# Patient Record
Sex: Male | Born: 1935 | Race: Black or African American | Hispanic: No | Marital: Married | Smoking: Former smoker
Health system: Southern US, Community
[De-identification: ages and names within clinical notes are randomized; demographics above are authoritative.]

## PROBLEM LIST (undated history)

## (undated) DIAGNOSIS — I10 Essential (primary) hypertension: Secondary | ICD-10-CM

## (undated) DIAGNOSIS — R443 Hallucinations, unspecified: Secondary | ICD-10-CM

## (undated) DIAGNOSIS — G3183 Dementia with Lewy bodies: Secondary | ICD-10-CM

## (undated) DIAGNOSIS — F0280 Dementia in other diseases classified elsewhere without behavioral disturbance: Secondary | ICD-10-CM

## (undated) DIAGNOSIS — E1142 Type 2 diabetes mellitus with diabetic polyneuropathy: Secondary | ICD-10-CM

## (undated) DIAGNOSIS — R269 Unspecified abnormalities of gait and mobility: Secondary | ICD-10-CM

## (undated) DIAGNOSIS — H538 Other visual disturbances: Secondary | ICD-10-CM

## (undated) DIAGNOSIS — I639 Cerebral infarction, unspecified: Secondary | ICD-10-CM

## (undated) DIAGNOSIS — H532 Diplopia: Secondary | ICD-10-CM

## (undated) DIAGNOSIS — E119 Type 2 diabetes mellitus without complications: Secondary | ICD-10-CM

## (undated) DIAGNOSIS — G2581 Restless legs syndrome: Secondary | ICD-10-CM

## (undated) DIAGNOSIS — E7800 Pure hypercholesterolemia, unspecified: Secondary | ICD-10-CM

## (undated) DIAGNOSIS — I350 Nonrheumatic aortic (valve) stenosis: Secondary | ICD-10-CM

## (undated) HISTORY — DX: Unspecified abnormalities of gait and mobility: R26.9

## (undated) HISTORY — PX: WHIPPLE PROCEDURE: SHX2667

## (undated) HISTORY — DX: Pure hypercholesterolemia, unspecified: E78.00

## (undated) HISTORY — DX: Diplopia: H53.2

## (undated) HISTORY — DX: Type 2 diabetes mellitus without complications: E11.9

## (undated) HISTORY — DX: Nonrheumatic aortic (valve) stenosis: I35.0

## (undated) HISTORY — DX: Other visual disturbances: H53.8

## (undated) HISTORY — DX: Essential (primary) hypertension: I10

## (undated) HISTORY — DX: Type 2 diabetes mellitus with diabetic polyneuropathy: E11.42

## (undated) HISTORY — DX: Dementia in other diseases classified elsewhere, unspecified severity, without behavioral disturbance, psychotic disturbance, mood disturbance, and anxiety: F02.80

## (undated) HISTORY — DX: Hallucinations, unspecified: R44.3

## (undated) HISTORY — DX: Dementia with Lewy bodies: G31.83

## (undated) HISTORY — DX: Cerebral infarction, unspecified: I63.9

## (undated) HISTORY — PX: INGUINAL HERNIA REPAIR: SUR1180

## (undated) HISTORY — DX: Restless legs syndrome: G25.81

---
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
## 1998-01-19 ENCOUNTER — Ambulatory Visit (HOSPITAL_COMMUNITY): Admission: RE | Admit: 1998-01-19 | Discharge: 1998-01-19 | Payer: Self-pay | Admitting: Family Medicine

## 1999-07-05 ENCOUNTER — Encounter: Admission: RE | Admit: 1999-07-05 | Discharge: 1999-07-05 | Payer: Self-pay | Admitting: Family Medicine

## 2001-02-25 ENCOUNTER — Encounter: Payer: Self-pay | Admitting: Family Medicine

## 2001-02-25 ENCOUNTER — Encounter: Admission: RE | Admit: 2001-02-25 | Discharge: 2001-02-25 | Payer: Self-pay

## 2002-05-25 ENCOUNTER — Encounter: Payer: Self-pay | Admitting: Neurology

## 2002-05-25 ENCOUNTER — Ambulatory Visit (HOSPITAL_COMMUNITY): Admission: RE | Admit: 2002-05-25 | Discharge: 2002-05-25 | Payer: Self-pay | Admitting: Neurology

## 2002-09-22 HISTORY — PX: PROSTATE SURGERY: SHX751

## 2003-10-10 ENCOUNTER — Encounter: Admission: RE | Admit: 2003-10-10 | Discharge: 2003-10-10 | Payer: Self-pay | Admitting: Family Medicine

## 2003-10-18 ENCOUNTER — Encounter: Admission: RE | Admit: 2003-10-18 | Discharge: 2003-10-18 | Payer: Self-pay | Admitting: Family Medicine

## 2003-10-25 ENCOUNTER — Encounter: Admission: RE | Admit: 2003-10-25 | Discharge: 2003-10-25 | Payer: Self-pay | Admitting: Family Medicine

## 2004-03-07 ENCOUNTER — Encounter: Admission: RE | Admit: 2004-03-07 | Discharge: 2004-03-07 | Payer: Self-pay | Admitting: Family Medicine

## 2005-12-04 ENCOUNTER — Encounter: Admission: RE | Admit: 2005-12-04 | Discharge: 2005-12-04 | Payer: Self-pay | Admitting: *Deleted

## 2005-12-10 ENCOUNTER — Ambulatory Visit: Admission: RE | Admit: 2005-12-10 | Discharge: 2005-12-10 | Payer: Self-pay | Admitting: Family Medicine

## 2006-08-05 ENCOUNTER — Ambulatory Visit: Payer: Self-pay | Admitting: Gastroenterology

## 2006-09-03 ENCOUNTER — Ambulatory Visit: Payer: Self-pay | Admitting: Gastroenterology

## 2007-06-23 ENCOUNTER — Ambulatory Visit: Payer: Self-pay | Admitting: Vascular Surgery

## 2008-09-22 HISTORY — PX: CATARACT EXTRACTION: SUR2

## 2008-10-27 ENCOUNTER — Ambulatory Visit: Payer: Self-pay | Admitting: Vascular Surgery

## 2009-03-05 ENCOUNTER — Encounter: Admission: RE | Admit: 2009-03-05 | Discharge: 2009-03-05 | Payer: Self-pay | Admitting: General Surgery

## 2009-03-08 ENCOUNTER — Ambulatory Visit (HOSPITAL_BASED_OUTPATIENT_CLINIC_OR_DEPARTMENT_OTHER): Admission: RE | Admit: 2009-03-08 | Discharge: 2009-03-08 | Payer: Self-pay | Admitting: General Surgery

## 2009-11-20 ENCOUNTER — Encounter: Admission: RE | Admit: 2009-11-20 | Discharge: 2009-11-20 | Payer: Self-pay | Admitting: Neurology

## 2010-07-17 ENCOUNTER — Ambulatory Visit: Payer: Self-pay | Admitting: Vascular Surgery

## 2010-12-30 LAB — URINALYSIS, ROUTINE W REFLEX MICROSCOPIC
Bilirubin Urine: NEGATIVE
Glucose, UA: NEGATIVE mg/dL
Protein, ur: NEGATIVE mg/dL
Specific Gravity, Urine: 1.023 (ref 1.005–1.030)

## 2010-12-30 LAB — CBC
HCT: 33.4 % — ABNORMAL LOW (ref 39.0–52.0)
Hemoglobin: 11.6 g/dL — ABNORMAL LOW (ref 13.0–17.0)
MCHC: 34.6 g/dL (ref 30.0–36.0)
MCV: 93 fL (ref 78.0–100.0)
RBC: 3.59 MIL/uL — ABNORMAL LOW (ref 4.22–5.81)
RDW: 14 % (ref 11.5–15.5)

## 2010-12-30 LAB — PROTIME-INR
INR: 1.1 (ref 0.00–1.49)
Prothrombin Time: 14.2 seconds (ref 11.6–15.2)

## 2010-12-30 LAB — BASIC METABOLIC PANEL
Creatinine, Ser: 1.07 mg/dL (ref 0.4–1.5)
GFR calc Af Amer: >60 mL/min (ref >60)

## 2010-12-30 LAB — APTT: aPTT: 31 seconds (ref 24–37)

## 2011-02-04 NOTE — Op Note (Signed)
[name], [name]             ACCOUNT NO.:  [id number]   MEDICAL RECORD NO.:  [id number]          PATIENT TYPE:  AMB   LOCATION:  DSC                          FACILITY:  [facility]   PHYSICIAN:  Almond Lint, MD       DATE OF BIRTH:  1923-04-07   DATE OF PROCEDURE:  03/08/2009  DATE OF DISCHARGE:                               OPERATIVE REPORT   PREOPERATIVE DIAGNOSIS:  Right inguinal hernia.   POSTOPERATIVE DIAGNOSIS:  Right inguinal hernia.   PROCEDURE PERFORMED:  Right inguinal herniorrhaphy with mesh.   SURGEON:  Almond Lint, MD   ASSISTANT:  None.   ANESTHESIA:  General and local.   FINDINGS:  Indirect right inguinal hernia and large cord lipoma and  there were no contents of the abdominal cavity and the sac.   ESTIMATED BLOOD LOSS:  Minimal.   COMPLICATIONS:  None known.   PROCEDURE:  [name] was identified in the holding area and taken to  the operating room where he was placed supine on the operating room  table.  General anesthesia with LMA was induced.  The right groin was  clipped, prepped, and draped in a sterile fashion.  The time-out was  performed according to the surgical safety check list.  When all was  correct, we continued.  The pubis was identified as well as anterior-  superior iliac spine.  A curvilinear oblique incision was marked between  these 2 points.  Approximately, a centimeter incision was made in the  center of this line.  The subcutaneous tissues were divided with the  Bovie electrocautery.  The Scarpa fascia was divided with the Bovie as  well.  The external oblique fascia was cleaned off bluntly with a  Kittner.  There was 1 small vein encountered at this time and this was  also coagulated.  The externally oblique fascia was opened with a #15  blade and the Metzenbaum scissors were used to elevate the fascia and  cut down to the external inguinal ring inferomedially and then  superolaterally several centimeters and to expose the  internal ring.  The spermatic cord was identified and was elevated off the pubis.  A  Penrose drain was passed around it to retract.   The hernia sac was identified on the medial surface of the spermatic  cord.  This was extremely adherent to the spermatic cord as this patient  had a hernia for 5-10 years.  The hernia sac was elevated with hemostats  and painstakingly dissected from the spermatic cord.  Care was taken to  make sure that the vas deferens was dissected off the sac.  The vas  deferens was identified and was taken down off the sac all the way up to  the internal ring.  The other vessels were not quite so adherent to the  sac.  The sac was cleaned off in all sides to the external ring.  It was  then opened and the contents examined.  There was small bowel seen at  the very posterior most aspect of the sac, but this was still in the  abdomen.  There were no sliding components seen.  This sac was closed  with a purse-string suture of 2-0 silk under direct visualization making  sure that no intraabdominal contents pushed out into the sac.  Following  the purse-string suture, this was then ligated doubly with a 2-0 silk.  The sac was transected and the stump was allowed to retract back into  the abdomen.  Polypropylene mesh was then cut to the appropriate size  and secured to the inferior shelving edge with a 2-0 Prolene in a  running fashion.  Tails were cut to pass around the spermatic cord and  then the superior aspect of the mesh was used to secure this to the  shelving edge superiorly.  The tails were then secured to one another  with a 2-0 Prolene.  The end of the mesh was tucked up underneath the  external oblique fascia and pressed flat.  There was still adequate gap  in the spermatic cord to allow for passage of forceps.  The external  oblique was then closed over the top of the mesh and the spermatic cord  with a 2-0 Vicryl in a running fashion.  This was then irrigated.   Prior  to closure of Scarpa fascia, the area underneath the mesh was  anesthetized.  When placing the stitch for the superior shelving edge,  the ilioinguinal nerve was kept above the level of the mesh.  This was  done in order to avoid incorporating the nerve into the suture.  Scarpa's was then closed using a 2-0 Vicryl in a running fashion and  then the skin was closed with 3-0 Vicryl deep dermal and 4-0 Monocryl  subcuticular.  The wound was cleaned, dried, and dressed with Dermabond.  The patient was awakened from anesthesia and taken to the PACU in stable  condition.      Almond Lint, MD  Electronically Signed     FB/MEDQ  D:  03/08/2009  T:  03/08/2009  Job:  [id number]

## 2011-02-04 NOTE — Assessment & Plan Note (Signed)
OFFICE VISIT   KENNAN, DETTER  DOB:  [birth year]                                       07/17/2010  KGMWN#:[id number]   Patient presents today for evaluation of venous pathology of his left  leg.  I had met patient initially with some concern regarding arterial  insufficiency in February 2010.  He was found to have normal lower  extremity arterial insufficiency, and we had discussed his venous  pathology at that time.  He presents today for concern of increasing  discomfort in the large plexus of varicosities in his left medial calf.  He does not have any history of DVT.  Does not have any history of  bleeding from these.  He does report some pain with these with prolonged  standing, and this does not appear to be severe.   PAST HISTORY:  Significant for hypertension, diabetes, elevated  cholesterol.  Does have a history of a prior Whipple.  He does have a  history of a prior prostatectomy.   He is married with 4 children.  He is retired.   PHYSICAL EXAMINATION:  A well-developed, well-nourished gentleman  appearing stated age of 49.  Blood pressure is 114/76, pulse 81,  respirations 18.  He is in no acute distress.  HEENT is normal.  His  radial and dorsalis pedis pulses are 2+ bilaterally.  He does have a  large plexus of veins in the medial aspect of his left calf.   He underwent noninvasive vascular laboratory studies in our office, and  this does reveal reflux throughout his left great saphenous vein with  flow into these large tributary varicosities.   I had a long discussion with patient and his wife present.  I explained  that there was no danger related to this, and we would recommend  treatment only if he was having significant discomfort.  I explained  that the treatment would be laser ablation of his left great saphenous  vein for reduction of his venous hypertension.  He reports that he is  able to tolerate this level of discomfort and  will notify us should he  have any progressive symptoms.  He has attempted to wear graduated  compression garments in the past and is not able to tolerate these and  does wear a light-grade support stocking and was encouraged to continue  this.  We will see him again on an as-needed basis.     Larina Earthly, M.D.  Electronically Signed   TFE/MEDQ  D:  07/17/2010  T:  07/17/2010  Job:  [id number]   cc:   Dr. Clyda Greener

## 2011-02-04 NOTE — Procedures (Signed)
LOWER EXTREMITY VENOUS REFLUX EXAM   INDICATION:   EXAM:  Using color-flow imaging and pulse Doppler spectral analysis, the  left common femoral, superficial femoral, popliteal, posterior tibial,  greater and lesser saphenous veins are evaluated.  There is evidence  suggesting deep venous insufficiency in the superficial femoral vein of  the left lower extremity.   The left saphenofemoral junction is not competent with Reflux of  >552milliseconds. The left GSV is not competent with Reflux of  >571milliseconds with the caliber as described below.   The left proximal short saphenous vein was not seen.   GSV Diameter (used if found to be incompetent only)                                            Right         Left  Proximal Greater Saphenous Vein           cm            0.92 cm  Proximal-to-mid-thigh                     cm            cm  Mid thigh                                 cm            1.09 cm  Mid-distal thigh                          cm            cm  Distal thigh                              cm            0.82 cm  Knee                                      cm            cm   IMPRESSION:  1. Left greater saphenous vein is not competent with reflux with      >510milliseconds.  2. The left greater saphenous vein is not tortuous.  3. The deep venous system is not competent with reflux of      >576milliseconds.  4. The left short saphenous vein is not seen.   ___________________________________________  Larina Earthly, M.D.   OD/MEDQ  D:  07/17/2010  T:  07/17/2010  Job:  [id number]

## 2011-02-04 NOTE — Consult Note (Signed)
NEW PATIENT CONSULTATION   Belsito, Phoenyx V  DOB:  1923-02-05                                       06/23/2007  ZOXWR#:[id number]   HISTORY:  Javan Gonzaga presents today for evaluation of left leg  venous varicosities.  He is a very pleasant, active 77 year old  gentleman with progressive discomfort over his left medial calf from  venous varicosities.  He reports that he has had these for many years.  He does report an aching, burning sensation in his left calf with  prolonged standing and does have some mild swelling.  He does not have  any history of DVT or superficial thrombophlebitis.  He does not have  any history of bleeding from his varicosities.  He does wear  nonprescription-grade compression stockings, elevates his legs when he  can.   PAST MEDICAL HISTORY:  Significant for anemia, type 2 diabetes, elevated  cholesterol, hypertension.  He does have a history of prior Whipple  resection in [place] in 2003 and also had a prostatectomy in 1994.  He  has a questionable history of abdominal aortic aneurysm.   PHYSICAL EXAMINATION:  GENERAL:  Well-developed, well-nourished black  male appearing stated age of 78.  VITAL SIGNS:  Blood pressure 120/70, pulse 64, respirations 16.  VASCULAR:  He does have 1 to 2+ posterior tibial pulses bilaterally.  He  does not have any evidence of varicosities in his right leg.  Left leg  is noted for marked calf vein varicosities in his medial calf with some  mild tenderness over these.  His saphenous vein is not enlarged in his  thigh.   He underwent a handheld duplex by me in the office, and this reveals  marked reflux in his left great saphenous vein extending into the  varicosities.  I discussed this at length with Mr. Uhde and his wife  present.  I explained that we would recommend better compression grade  compression stockings, and we have fitted him for these today.  I did  explain the option of laser  ablation of his saphenous vein and stab  phlebectomy for  tributary removals should he have progressive pain with  this.  I explained that it is safe to continue observation only and our  indication for recommending the surgery would be progressive discomfort.  He understands this and understands that this is an outpatient procedure  in our office.  We will fit him with compression garments and see him on  an as-needed basis.   Larina Earthly, M.D.  Electronically Signed   TFE/MEDQ  D:  06/23/2007  T:  06/24/2007  Job:  [id number]   cc:   Madaline Savage., M.D.

## 2011-02-04 NOTE — Assessment & Plan Note (Signed)
OFFICE VISIT   JOSE, CORVIN  DOB:  23-Mar-1923                                       10/27/2008  IONGE#:[id number]   The patient presents today for evaluation of left greater than right leg  discomfort.  He is a very pleasant 77 year old gentleman who I had seen  on 06/23/2007 for concern regarding left leg varicosities.  He is a very  active gentleman.  He does continue to report some aching and discomfort  with prolonged standing, he does have some burning sensation over this  but he also has now developed some numbness in his left foot as well.  He has very dry skin and he does have itching bilaterally related to  this.  He has a past history significant for noninsulin dependent  diabetes, hypertension, elevated cholesterol.  He is retired.  He does  not smoke, drink alcohol.  He is on an active walking program along with  his wife.   CURRENT MEDICATIONS:  Lisinopril, metoprolol, Zetia, Iron supplement and  daily aspirin therapy.   He underwent noninvasive vascular laboratory studies in our office today  revealing normal ankle arm indices bilaterally.   PHYSICAL EXAMINATION:  He does have 2+ posterior tibial pulses  bilaterally.  He does have some dry skin bilaterally.  He has  varicosities in his medial calf with no evidence of ulceration or skin  breakdown.  Venous imaging reveals incompetence in his saphenous vein on  the left.   I again discussed this at length with the patient and his wife.  I do  not feel that he has any evidence of limb-threatening difficulty and I  explained the importance of his normal arterial flow.  I explained the  option of treatment for his varicosities, but since this is such a mild  disability to him I would recommend continued observation only.  They  are comfortable with this discussion and will see me again on an as-  needed basis.   Larina Earthly, M.D.  Electronically Signed   TFE/MEDQ  D:  10/27/2008   T:  10/30/2008  Job:  [id number]   cc:   Madaline Savage

## 2011-02-07 NOTE — Letter (Signed)
August 05, 2006    Mr. [name]  [address]  [place], [place] [address]   RE:  GRAIDEN, HENES  MRN:  [id number]  /  DOB:  10-Jan-1923   Dear [name],:   It is my pleasure to have treated you recently as a new patient in my  office.  I appreciate your confidence and the opportunity to participate in  your care.   Since I do have a busy inpatient endoscopy schedule and office schedule, my  office hours vary weekly.  I am, however, available for emergency calls  every day through my office.  If I cannot promptly meet an urgent office  appointment, another one of our gastroenterologists will be able to assist  you.   My well-trained staff are prepared to help you at all times.  For  emergencies after office hours, a physician from our gastroenterology  section is always available through my 24-hour answering service.   While you are under my care, I encourage discussion of your questions and  concerns, and I will be happy to return your calls as soon as I am  available.   Once again, I welcome you as a new patient and I look forward to a happy and  healthy relationship.    Sincerely,      Barbette Hair. Arlyce Dice, MD,FACG  Electronically Signed    RDK/MedQ  DD: 08/06/2006  DT: 08/06/2006  Job #: [id number]

## 2011-02-07 NOTE — Assessment & Plan Note (Signed)
[facility]                           GASTROENTEROLOGY OFFICE NOTE   NAME:[name], [name]                    MRN:          [id number]  DATE:08/05/2006                            DOB:          [birth year]    REASON FOR CONSULTATION:  Abdominal discomfort.   HISTORY:  [name] is a pleasant 77 year old African-American male  referred through the courtesy of Dr. Bruna Potter for evaluation.  Several weeks  ago, he had upper abdominal burning which eventually radiated to his lower  abdomen.  This was associated with a fever to 100.  These symptoms have  subsequently subsided.  He does complain of occasional abdominal bloating  with constipation.  There is no history of melena or hematochezia.   PAST MEDICAL HISTORY:  Pertinent for hypertension and diabetes.  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   FAMILY HISTORY:  Not contributory.   MEDICATIONS:  Include lisinopril, Zetia, metoprolol, and iron.   ALLERGIES:  There are no allergies.   SOCIAL HISTORY:  He is married and retired.   REVIEW OF SYSTEMS:  Just reviewed and is negative.   PHYSICAL EXAMINATION:  On exam, pulse 56, blood pressure 114/72, weight 188.  HEENT: EOMI. PERRLA. Sclerae are anicteric.  Conjunctivae are pink.  NECK:  Supple without thyromegaly, adenopathy or carotid bruits.  CHEST:  Clear to auscultation and percussion without adventitious sounds.  CARDIAC:  Regular rhythm; normal S1 S2.  There are no murmurs, gallops or  rubs.  ABDOMEN:  Bowel sounds are normoactive.  Abdomen is soft, non-tender and non-  distended.  There are no abdominal masses, tenderness, splenic enlargement  or hepatomegaly.  EXTREMITIES:  Full range of motion.  No cyanosis, clubbing or edema.  RECTAL:  Deferred.   IMPRESSION:  Constipation -- stable.   RECOMMENDATIONS:  Screening colonoscopy.    Barbette Hair. Arlyce Dice, MD,FACG  Electronically Signed   RDK/MedQ  DD: 08/06/2006  DT: 08/06/2006  Job #: [id number]   cc:   Billy Greener, MD

## 2011-02-07 NOTE — Letter (Signed)
August 05, 2006    Dr. Clyda Greener  [address] [address]  [place], [place] [address]   RE:  Billy, Williams  MRN:  [id number]  /  DOB:  02/20/1923   Dear Dr. Bruna Potter,   Upon your kind referral, I had the pleasure of evaluating your patient and I  am pleased to offer my findings.  I saw Mr. [name] in the office  today.  Enclosed is a copy of my progress note that details my findings and  recommendations.   Thank you for the opportunity to participate in your patient's care.    Sincerely,      Barbette Hair. Arlyce Dice, MD,FACG  Electronically Signed    RDK/MedQ  DD: 08/06/2006  DT: 08/06/2006  Job #: [id number]

## 2011-03-18 ENCOUNTER — Inpatient Hospital Stay (INDEPENDENT_AMBULATORY_CARE_PROVIDER_SITE_OTHER)
Admission: RE | Admit: 2011-03-18 | Discharge: 2011-03-18 | Disposition: A | Discharge status: Home / Self Care | Payer: Medicare Other | Source: Ambulatory Visit | Attending: Emergency Medicine | Admitting: Emergency Medicine

## 2011-03-18 DIAGNOSIS — S61209A Unspecified open wound of unspecified finger without damage to nail, initial encounter: Secondary | ICD-10-CM

## 2012-09-07 DIAGNOSIS — R42 Dizziness and giddiness: Secondary | ICD-10-CM | POA: Insufficient documentation

## 2012-09-07 DIAGNOSIS — I6789 Other cerebrovascular disease: Secondary | ICD-10-CM | POA: Insufficient documentation

## 2012-09-07 DIAGNOSIS — F068 Other specified mental disorders due to known physiological condition: Secondary | ICD-10-CM | POA: Insufficient documentation

## 2012-09-07 DIAGNOSIS — G3183 Dementia with Lewy bodies: Secondary | ICD-10-CM | POA: Insufficient documentation

## 2012-09-07 DIAGNOSIS — G479 Sleep disorder, unspecified: Secondary | ICD-10-CM | POA: Insufficient documentation

## 2012-09-07 DIAGNOSIS — E1149 Type 2 diabetes mellitus with other diabetic neurological complication: Secondary | ICD-10-CM | POA: Insufficient documentation

## 2012-09-07 DIAGNOSIS — G2581 Restless legs syndrome: Secondary | ICD-10-CM | POA: Insufficient documentation

## 2012-09-07 DIAGNOSIS — R413 Other amnesia: Secondary | ICD-10-CM | POA: Insufficient documentation

## 2012-09-07 DIAGNOSIS — H532 Diplopia: Secondary | ICD-10-CM | POA: Insufficient documentation

## 2012-09-07 DIAGNOSIS — G609 Hereditary and idiopathic neuropathy, unspecified: Secondary | ICD-10-CM | POA: Insufficient documentation

## 2012-09-07 DIAGNOSIS — G544 Lumbosacral root disorders, not elsewhere classified: Secondary | ICD-10-CM | POA: Insufficient documentation

## 2012-09-07 DIAGNOSIS — R209 Unspecified disturbances of skin sensation: Secondary | ICD-10-CM | POA: Insufficient documentation

## 2012-09-08 ENCOUNTER — Other Ambulatory Visit: Payer: Self-pay | Admitting: Neurology

## 2012-09-08 DIAGNOSIS — F0390 Unspecified dementia without behavioral disturbance: Secondary | ICD-10-CM

## 2012-09-16 ENCOUNTER — Ambulatory Visit
Admission: RE | Admit: 2012-09-16 | Discharge: 2012-09-16 | Disposition: A | Discharge status: Home / Self Care | Payer: Medicare Other | Source: Ambulatory Visit | Attending: Neurology | Admitting: Neurology

## 2012-09-16 DIAGNOSIS — F0390 Unspecified dementia without behavioral disturbance: Secondary | ICD-10-CM

## 2013-02-22 ENCOUNTER — Ambulatory Visit (INDEPENDENT_AMBULATORY_CARE_PROVIDER_SITE_OTHER): Payer: Medicare Other | Admitting: Neurology

## 2013-02-22 ENCOUNTER — Encounter: Payer: Self-pay | Admitting: Neurology

## 2013-02-22 VITALS — BP 120/68 | HR 74 | Ht 67.0 in | Wt 155.0 lb

## 2013-02-22 DIAGNOSIS — G479 Sleep disorder, unspecified: Secondary | ICD-10-CM

## 2013-02-22 DIAGNOSIS — I6789 Other cerebrovascular disease: Secondary | ICD-10-CM

## 2013-02-22 DIAGNOSIS — R42 Dizziness and giddiness: Secondary | ICD-10-CM

## 2013-02-22 DIAGNOSIS — G609 Hereditary and idiopathic neuropathy, unspecified: Secondary | ICD-10-CM

## 2013-02-22 DIAGNOSIS — R413 Other amnesia: Secondary | ICD-10-CM

## 2013-02-22 DIAGNOSIS — H532 Diplopia: Secondary | ICD-10-CM

## 2013-02-22 DIAGNOSIS — F068 Other specified mental disorders due to known physiological condition: Secondary | ICD-10-CM

## 2013-02-22 DIAGNOSIS — F0280 Dementia in other diseases classified elsewhere without behavioral disturbance: Secondary | ICD-10-CM

## 2013-02-22 DIAGNOSIS — R209 Unspecified disturbances of skin sensation: Secondary | ICD-10-CM

## 2013-02-22 DIAGNOSIS — G3183 Dementia with Lewy bodies: Secondary | ICD-10-CM

## 2013-02-22 DIAGNOSIS — E1149 Type 2 diabetes mellitus with other diabetic neurological complication: Secondary | ICD-10-CM

## 2013-02-22 DIAGNOSIS — G2581 Restless legs syndrome: Secondary | ICD-10-CM

## 2013-02-22 DIAGNOSIS — G544 Lumbosacral root disorders, not elsewhere classified: Secondary | ICD-10-CM

## 2013-02-22 NOTE — Progress Notes (Signed)
Reason for visit: Lewy body dementia  [name] is an 78 y.o. male  History of present illness:  [name] is an 79 year old right-handed black male with a history of diabetes. The patient has sustained a gradually progressive problem with memory dating back at least 3 or 4 years. The patient initially was having some problems with paranoia, hiding things about the house on a regular basis. The patient however, has demonstrated less of this activity as he is becoming more demented. The patient is on Aricept and Namenda. The patient is sleeping well at night, but he has a lot of problems with visual hallucinations. It is not apparent that these hallucinations result in any agitation or aggressive behavior on the part the patient, but the hallucinations do upset his wife. The patient will see other individuals in the house, and he will want his wife to fix them dinner. The patient does have some gait imbalance, but he has not fallen recently. Within the last 6 months, his wife can no longer leave him at church, as he becomes disoriented unless she is around. The patient returns for an evaluation.  Past Medical History  Diagnosis Date  . Stroke   . Diabetes   . High cholesterol   . HBP (high blood pressure)   . Blurred vision   . Lewy body dementia   . Restless leg syndrome   . Diplopia   . Polyneuropathy in diabetes(357.2)   . Hallucinations     Past Surgical History  Procedure Laterality Date  . Hemorrhoid surgery  1950's  . Prostate surgery  2004  . Cataract extraction Bilateral 2010  . Whipple procedure    . Inguinal hernia repair Right     History reviewed. No pertinent family history.  Social history:  reports that he has quit smoking. He does not have any smokeless tobacco history on file. He reports that he does not drink alcohol or use illicit drugs.  Allergies:  Allergies  Allergen Reactions  . Penicillins     Reaction unknown    Medications:  No  current outpatient prescriptions on file prior to visit.   No current facility-administered medications on file prior to visit.    ROS:  Out of a complete 14 system review of symptoms, the patient complains only of the following symptoms, and all other reviewed systems are negative.  Memory disturbance, confusion, hallucinations Gait disturbance  Blood pressure 120/68, pulse 74, height 5\' 7"  (1.702 m), weight 155 lb (70.308 kg).  Physical Exam  General: The patient is alert and cooperative at the time of the examination.  Skin: No significant peripheral edema is noted.   Neurologic Exam  Mental status: Mini-Mental status examination done today shows a total score of 15/30. The patient is able to name 11 animals in 60 seconds.  Cranial nerves: Facial symmetry is present. Speech is normal, no aphasia or dysarthria is noted. Extraocular movements are full. Visual fields are full.  Motor: The patient has good strength in all 4 extremities.  Coordination: The patient has good finger-nose-finger bilaterally. Apraxia with the use of the lower extremities is seen.  Gait and station: The patient has a normal gait. The patient has good arm swing with walking. Tandem gait is unsteady, apraxic. Romberg is negative. No drift is seen.  Reflexes: Deep tendon reflexes are symmetric.   Assessment/Plan:  1. Lewy body dementia  The patient continues to progress with his memory. The patient is in a moderate dementia range at  this point. The patient will be continued on Aricept and Namenda. He will followup in 6 months. Unless the hallucinations are resulting in significant behavioral issues and management problems, I would not treat. Individuals with Lewy body dementia are often times quite sensitive to antipsychotic medications. The family will contact our office if new problems arise.  Marlan Palau MD 02/22/2013 8:13 PM  [facility] [address] [place], [place] [address]  Phone [phone] Fax [phone]

## 2013-03-30 ENCOUNTER — Telehealth: Payer: Self-pay | Admitting: Neurology

## 2013-04-01 NOTE — Telephone Encounter (Signed)
Voice message left requesting a call-back. 

## 2013-04-04 ENCOUNTER — Telehealth: Payer: Self-pay | Admitting: Neurology

## 2013-04-04 NOTE — Telephone Encounter (Signed)
I spoke to daughter and relayed that [facility] does not pay for in home care (custodial care).  [facility] is a program that may be helpful, also [facility], [facility], etc. Not sure what [facility] may help with.   MD's can order HHC eval.  She would investigate options.

## 2013-04-10 ENCOUNTER — Other Ambulatory Visit: Payer: Self-pay | Admitting: Neurology

## 2013-04-12 ENCOUNTER — Telehealth: Payer: Self-pay | Admitting: Neurology

## 2013-04-12 ENCOUNTER — Other Ambulatory Visit: Payer: Self-pay

## 2013-04-12 MED ORDER — DONEPEZIL HCL 5 MG PO TABS: 5.0000 mg | ORAL_TABLET | Freq: Two times a day (BID) | ORAL | Status: DC

## 2013-04-12 MED ORDER — MEMANTINE HCL ER 28 MG PO CP24: 28.0000 mg | ORAL_CAPSULE | Freq: Every day | ORAL | Status: DC

## 2013-04-12 NOTE — Telephone Encounter (Signed)
This has already been taken care of. Duplicate message

## 2013-06-06 ENCOUNTER — Telehealth: Payer: Self-pay | Admitting: Neurology

## 2013-06-06 ENCOUNTER — Other Ambulatory Visit: Payer: Self-pay

## 2013-06-06 MED ORDER — MEMANTINE HCL 10 MG PO TABS: 10.0000 mg | ORAL_TABLET | Freq: Two times a day (BID) | ORAL | Status: DC

## 2013-06-06 NOTE — Telephone Encounter (Signed)
[facility] called stating they are unable to fill the Rx for Namenda XR because it is on backorder.  I have sent a Rx for Namenda 10mg  BID for them to fill until XR form is available again.

## 2013-06-28 ENCOUNTER — Ambulatory Visit: Payer: Medicare Other | Admitting: Interventional Cardiology

## 2013-07-07 ENCOUNTER — Encounter: Payer: Self-pay | Admitting: *Deleted

## 2013-07-07 ENCOUNTER — Encounter: Payer: Self-pay | Admitting: Interventional Cardiology

## 2013-07-10 DIAGNOSIS — I350 Nonrheumatic aortic (valve) stenosis: Secondary | ICD-10-CM | POA: Insufficient documentation

## 2013-07-11 ENCOUNTER — Encounter: Payer: Self-pay | Admitting: Interventional Cardiology

## 2013-07-11 ENCOUNTER — Ambulatory Visit (INDEPENDENT_AMBULATORY_CARE_PROVIDER_SITE_OTHER): Payer: Medicare Other | Admitting: Interventional Cardiology

## 2013-07-11 ENCOUNTER — Encounter (INDEPENDENT_AMBULATORY_CARE_PROVIDER_SITE_OTHER): Payer: Self-pay

## 2013-07-11 VITALS — BP 108/70 | HR 74 | Ht 67.0 in | Wt 146.8 lb

## 2013-07-11 DIAGNOSIS — F0280 Dementia in other diseases classified elsewhere without behavioral disturbance: Secondary | ICD-10-CM

## 2013-07-11 DIAGNOSIS — R42 Dizziness and giddiness: Secondary | ICD-10-CM

## 2013-07-11 DIAGNOSIS — I359 Nonrheumatic aortic valve disorder, unspecified: Secondary | ICD-10-CM

## 2013-07-11 DIAGNOSIS — I350 Nonrheumatic aortic (valve) stenosis: Secondary | ICD-10-CM

## 2013-07-11 NOTE — Progress Notes (Signed)
Patient ID: [name], male   DOB: 1922-10-11, 79 y.o.   MRN: [id number]    [address], [address] [place], [place]  [address] Phone: [phone] Fax:  [phone]  Date:  07/11/2013   ID:  [name], DOB August 12, 1923, MRN [id number]  PCP:  Burtis Junes, MD   ASSESSMENT:  1. Mild aortic stenosis, asymptomatic  2. Dementia, of the  Lewy Body variety  3. Hypertension, controlled  PLAN:  1. Clinical followup as needed. Reinforced the patient is clinically doing quite well. I will see as needed.   SUBJECTIVE: [name] is a 79 y.o. male who has no complaints. He appears somewhat confused. He is accompanied by his wife and they help her. No cardiac complaints. The wife states that his memory has become his major medical problem. He has severe memory loss. He still living at home. There've been no cardiac complaints. They denies syncope and falls. His appetite has been stable.   Wt Readings from Last 3 Encounters:  07/11/13 146 lb 12.8 oz (66.588 kg)  02/22/13 155 lb (70.308 kg)  09/07/12 150 lb (68.04 kg)     Past Medical History  Diagnosis Date  . Stroke   . Diabetes   . High cholesterol   . HBP (high blood pressure)   . Blurred vision   . Lewy body dementia   . Restless leg syndrome   . Diplopia   . Polyneuropathy in diabetes(357.2)   . Hallucinations   . Mild aortic stenosis     Current Outpatient Prescriptions  Medication Sig Dispense Refill  . aspirin 81 MG tablet Take 81 mg by mouth daily.      Marland Kitchen donepezil (ARICEPT) 5 MG tablet Take 1 tablet (5 mg total) by mouth 2 (two) times daily.  60 tablet  6  . memantine (NAMENDA) 10 MG tablet Take 1 tablet (10 mg total) by mouth 2 (two) times daily.  60 tablet  6  . Memantine HCl ER (NAMENDA XR) 28 MG CP24 Take 28 mg by mouth daily.  30 capsule  6  . nitroGLYCERIN (NITROSTAT) 0.4 MG SL tablet Place 0.4 mg under the tongue every 5 (five) minutes as needed for chest pain.      Marland Kitchen  omeprazole (PRILOSEC) 20 MG capsule Take 20 mg by mouth daily.      . vitamin E 200 UNIT capsule Take 200 Units by mouth daily.       No current facility-administered medications for this visit.    Allergies:    Allergies  Allergen Reactions  . Penicillins     Reaction unknown  . Zetia [Ezetimibe]     cramps    Social History:  The patient  reports that he has quit smoking. He does not have any smokeless tobacco history on file. He reports that he does not drink alcohol or use illicit drugs.   ROS:  Please see the history of present illness.      All other systems reviewed and negative.   OBJECTIVE: VS:  BP 108/70  Pulse 74  Ht 5\' 7"  (1.702 m)  Wt 146 lb 12.8 oz (66.588 kg)  BMI 22.99 kg/m2  SpO2 91% Well nourished, well developed, in no acute distress, elderly gentleman appearing younger than stated age HEENT: normal Neck: JVD none. Carotid bruit none  Cardiac:  normal S1, S2; RRR; 1/6 systolic diamond-shaped systolic  murmur Lungs:  clear to auscultation bilaterally, no wheezing, rhonchi or rales Abd: soft, nontender,  no hepatomegaly Ext: Edema  None . Pulses None  Skin: warm and dry Neuro:  CNs 2-12 intact, no focal abnormalities noted  EKG:  Normal sinus rhythm with prominent voltage consistent with left ventricular hypertrophy.       Signed, Darci Needle III, MD 07/11/2013 12:11 PM

## 2013-07-11 NOTE — Patient Instructions (Signed)
Your physician recommends that you continue on your current medications as directed. Please refer to the Current Medication list given to you today.   Your physician recommends that you schedule a follow-up appointment as needed  

## 2013-08-22 ENCOUNTER — Telehealth: Payer: Self-pay | Admitting: Neurology

## 2013-08-22 NOTE — Telephone Encounter (Signed)
I called and spoke with wife. I explained that we need her to sign a release of information so we can send the forms to [facility]. Wife will call them to see how she can work this out. I had asked her to call medial records as well as there may be a charge for the form, I am not certain of this.

## 2013-08-25 ENCOUNTER — Telehealth: Payer: Self-pay | Admitting: Neurology

## 2013-08-25 NOTE — Telephone Encounter (Addendum)
Spoke with Synetta Fail (fax to [facility] at [phone]) and she needs the attachments that's suppose to be with medical form, also physician did not check whether patient needed a special diet

## 2013-08-25 NOTE — Telephone Encounter (Signed)
Form sent on 08/24/13

## 2013-08-26 NOTE — Telephone Encounter (Signed)
I called and left VM for Synetta Fail. I had medical records fax over the forms I had received. The attachment was the snapshot cover sheet showing patient medical diagnoses and medication list. I indicated on the form that Anita's agency should contact the Speech Therapist whose name and phone number I added to the form I received, for more information of diet needs. I will fax over the missing attachments now.

## 2013-09-02 ENCOUNTER — Ambulatory Visit: Payer: Medicare Other | Admitting: Neurology

## 2013-09-02 ENCOUNTER — Telehealth: Payer: Self-pay | Admitting: Neurology

## 2013-09-02 NOTE — Telephone Encounter (Signed)
This patient did not show for the revisit appointment today. 

## 2013-09-06 ENCOUNTER — Encounter: Payer: Self-pay | Admitting: Neurology

## 2013-09-06 ENCOUNTER — Ambulatory Visit (INDEPENDENT_AMBULATORY_CARE_PROVIDER_SITE_OTHER): Payer: Medicare Other | Admitting: Neurology

## 2013-09-06 VITALS — BP 113/71 | HR 89 | Wt 139.0 lb

## 2013-09-06 DIAGNOSIS — F0280 Dementia in other diseases classified elsewhere without behavioral disturbance: Secondary | ICD-10-CM

## 2013-09-06 NOTE — Progress Notes (Signed)
Reason for visit: Memory disturbance  [name] is an 79 y.o. male  History of present illness:  [name] is an 79 year old right-handed black male with a history of a dementia, possibly Lewy body dementia. Since last seen, the patient has had ongoing progression of his memory deficits. The patient lives at home with his wife, and they get some aid and assistance 3 times a week to help him bathe. The patient requires assistance with bathing, dressing, taking his medications, and with keeping up with appointments. The patient continues to have some hallucinations off and on. The patient has some mild gait instability, but no falls. The patient is able to tell when he needs go to the bathroom, but he occasionally will have some episodes of incontinence. The patient sleeps fairly well at night. The patient will have occasional episodes of agitation. The patient goes to the [facility]. The patient returns to this office for an evaluation. The patient is on Namenda and Aricept, and he is losing weight, not eating well.  Past Medical History  Diagnosis Date  . Stroke   . Diabetes   . High cholesterol   . HBP (high blood pressure)   . Blurred vision   . Lewy body dementia   . Restless leg syndrome   . Diplopia   . Polyneuropathy in diabetes(357.2)   . Hallucinations   . Mild aortic stenosis     Past Surgical History  Procedure Laterality Date  . Hemorrhoid surgery  1950's  . Prostate surgery  2004  . Cataract extraction Bilateral 2010  . Whipple procedure    . Inguinal hernia repair Right     History reviewed. No pertinent family history.  Social history:  reports that he has quit smoking. He has never used smokeless tobacco. He reports that he does not drink alcohol or use illicit drugs.    Allergies  Allergen Reactions  . Penicillins     Reaction unknown  . Zetia [Ezetimibe]     cramps    Medications:  Current Outpatient Prescriptions on File  Prior to Visit  Medication Sig Dispense Refill  . aspirin 81 MG tablet Take 81 mg by mouth daily.      . memantine (NAMENDA) 10 MG tablet Take 1 tablet (10 mg total) by mouth 2 (two) times daily.  60 tablet  6  . nitroGLYCERIN (NITROSTAT) 0.4 MG SL tablet Place 0.4 mg under the tongue every 5 (five) minutes as needed for chest pain.      Marland Kitchen omeprazole (PRILOSEC) 20 MG capsule Take 20 mg by mouth daily.      . vitamin E 200 UNIT capsule Take 200 Units by mouth daily.       No current facility-administered medications on file prior to visit.    ROS:  Out of a complete 14 system review of symptoms, the patient complains only of the following symptoms, and all other reviewed systems are negative.  Appetite change Hearing loss Runny nose Dry eyes Memory loss, confusion Dizziness Hallucinations  Blood pressure 113/71, pulse 89, weight 139 lb (63.05 kg).  Physical Exam  General: The patient is alert and cooperative at the time of the examination.  Skin: No significant peripheral edema is noted.   Neurologic Exam  Mental status: The Mini-Mental status examination done today shows a total score of 0/30.  Cranial nerves: Facial symmetry is present. Speech is normal, no aphasia or dysarthria is noted. Extraocular movements are full. Visual fields are  full.  Motor: The patient has good strength in all 4 extremities.  Sensory examination: Soft touch sensation on the face, arms, and legs is symmetric.  Coordination: The patient has good finger-nose-finger bilaterally, but he has severe apraxia with heel-to-shin bilaterally.  Gait and station: The patient has a slightly unsteady gait. The patient is able to walk independently. Tandem gait was not attempted. Romberg is negative. No drift is seen.  Reflexes: Deep tendon reflexes are symmetric.   Assessment/Plan:  1. Progressive dementia, possible Lewy body dementia   The patient has had significant progression of his dementia. At  this point, the patient is having problems with his appetite, and weight loss. The patient will go off of Aricept at this time. The patient will remain on the Namenda The patient followup in 6 months.   Marlan Palau MD 09/06/2013 9:54 AM  [facility] [address] [place], [place] [address]  Phone [phone] Fax [phone]

## 2013-09-06 NOTE — Patient Instructions (Addendum)
Stop the Aricept (donepezil). Stay on the Namenda.Dementia Dementia is a general term for problems with brain function. A person with dementia has memory loss and a hard time with at least one other brain function such as thinking, speaking, or problem solving. Dementia can affect social functioning, how you do your job, your mood, or your personality. The changes may be hidden for a long time. The earliest forms of this disease are usually not detected by family or friends. Dementia can be:  Irreversible.  Potentially reversible.  Partially reversible.  Progressive. This means it can get worse over time. CAUSES  Irreversible dementia causes may include:  Degeneration of brain cells (Alzheimer's disease or lewy body dementia).  Multiple small strokes (vascular dementia).  Infection (chronic meningitis or Creutzfelt-Jakob disease).  Frontotemporal dementia. This affects younger people, age 24 to 44, compared to those who have Alzheimer's disease.  Dementia associated with other disorders like Parkinson's disease, Huntington's disease, or HIV-associated dementia. Potentially or partially reversible dementia causes may include:  Medicines.  Metabolic causes such as excessive alcohol intake, vitamin B12 deficiency, or thyroid disease.  Masses or pressure in the brain such as a tumor, blood clot, or hydrocephalus. SYMPTOMS  Symptoms are often hard to detect. Family members or coworkers may not notice them early in the disease process. Different people with dementia may have different symptoms. Symptoms can include:  A hard time with memory, especially recent memory. Long-term memory may not be impaired.  Asking the same question multiple times or forgetting something someone just said.  A hard time speaking your thoughts or finding certain words.  A hard time solving problems or performing familiar tasks (such as how to use a telephone).  Sudden changes in mood.  Changes in  personality, especially increasing moodiness or mistrust.  Depression.  A hard time understanding complex ideas that were never a problem in the past. DIAGNOSIS  There are no specific tests for dementia.   Your caregiver may recommend a thorough evaluation. This is because some forms of dementia can be reversible. The evaluation will likely include a physical exam and getting a detailed history from you and a family member. The history often gives the best clues and suggestions for a diagnosis.  Memory testing may be done. A detailed brain function evaluation called neuropsychologic testing may be helpful.  Lab tests and brain imaging (such as a CT scan or MRI scan) are sometimes important.  Sometimes observation and re-evaluation over time is very helpful. TREATMENT  Treatment depends on the cause.   If the problem is a vitamin deficiency, it may be helped or cured with supplements.  For dementias such as Alzheimer's disease, medicines are available to stabilize or slow the course of the disease. There are no cures for this type of dementia.  Your caregiver can help direct you to groups, organizations, and other caregivers to help with decisions in the care of you or your loved one. HOME CARE INSTRUCTIONS The care of individuals with dementia is varied and dependent upon the progression of the dementia. The following suggestions are intended for the person living with, or caring for, the person with dementia.  Create a safe environment.  Remove the locks on bathroom doors to prevent the person from accidentally locking himself or herself in.  Use childproof latches on kitchen cabinets and any place where cleaning supplies, chemicals, or alcohol are kept.  Use childproof covers in unused electrical outlets.  Install childproof devices to keep doors and windows secured.  Remove stove knobs or install safety knobs and an automatic shut-off on the stove.  Lower the temperature on  water heaters.  Label medicines and keep them locked up.  Secure knives, lighters, matches, power tools, and guns, and keep these items out of reach.  Keep the house free from clutter. Remove rugs or anything that might contribute to a fall.  Remove objects that might break and hurt the person.  Make sure lighting is good, both inside and outside.  Install grab rails as needed.  Use a monitoring device to alert you to falls or other needs for help.  Reduce confusion.  Keep familiar objects and people around.  Use night lights or dim lights at night.  Label items or areas.  Use reminders, notes, or directions for daily activities or tasks.  Keep a simple, consistent routine for waking, meals, bathing, dressing, and bedtime.  Create a calm, quiet environment.  Place large clocks and calendars prominently.  Display emergency numbers and home address near all telephones.  Use cues to establish different times of the day. An example is to open curtains to let the natural light in during the day.   Use effective communication.  Choose simple words and short sentences.  Use a gentle, calm tone of voice.  Be careful not to interrupt.  If the person is struggling to find a word or communicate a thought, try to provide the word or thought.  Ask one question at a time. Allow the person ample time to answer questions. Repeat the question again if the person does not respond.  Reduce nighttime restlessness.  Provide a comfortable bed.  Have a consistent nighttime routine.  Ensure a regular walking or physical activity schedule. Involve the person in daily activities as much as possible.  Limit napping during the day.  Limit caffeine.  Attend social events that stimulate rather than overwhelm the senses.  Encourage good nutrition and hydration.  Reduce distractions during meal times and snacks.  Avoid foods that are too hot or too cold.  Monitor chewing and  swallowing ability.  Continue with routine vision, hearing, dental, and medical screenings.  Only give over-the-counter or prescription medicines as directed by the caregiver.  Monitor driving abilities. Do not allow the person to drive when safe driving is no longer possible.  Register with an identification program which could provide location assistance in the event of a missing person situation. SEEK MEDICAL CARE IF:   New behavioral problems start such as moodiness, aggressiveness, or seeing things that are not there (hallucinations).  Any new problem with brain function happens. This includes problems with balance, speech, or falling a lot.  Problems with swallowing develop.  Any symptoms of other illness happen. Small changes or worsening in any aspect of brain function can be a sign that the illness is getting worse. It can also be a sign of another medical illness such as infection. Seeing a caregiver right away is important. SEEK IMMEDIATE MEDICAL CARE IF:   A fever develops.  New or worsened confusion develops.  New or worsened sleepiness develops.  Staying awake becomes hard to do. Document Released: 03/04/2001 Document Revised: 12/01/2011 Document Reviewed: 02/03/2011 [facility] Patient Information 2014 [place], [place].

## 2013-09-09 ENCOUNTER — Telehealth: Payer: Self-pay | Admitting: *Deleted

## 2013-09-09 MED ORDER — ESCITALOPRAM OXALATE 5 MG PO TABS: 5.0000 mg | ORAL_TABLET | Freq: Every day | ORAL | Status: DC

## 2013-09-09 NOTE — Telephone Encounter (Signed)
I called the son. The patient is having ongoing problems with agitation over the last couple weeks. I will add Lexapro taking 5 mg daily.

## 2013-09-09 NOTE — Telephone Encounter (Signed)
Please advise 

## 2013-09-10 ENCOUNTER — Other Ambulatory Visit: Payer: Self-pay | Admitting: Neurology

## 2013-09-10 DIAGNOSIS — F0391 Unspecified dementia with behavioral disturbance: Secondary | ICD-10-CM

## 2013-09-10 MED ORDER — ESCITALOPRAM OXALATE 5 MG PO TABS: 5.0000 mg | ORAL_TABLET | Freq: Every day | ORAL | Status: DC

## 2013-09-20 ENCOUNTER — Emergency Department (HOSPITAL_COMMUNITY)
Admission: EM | Admit: 2013-09-20 | Discharge: 2013-09-20 | Disposition: A | Discharge status: Home / Self Care | Payer: Medicare Other | Attending: Emergency Medicine | Admitting: Emergency Medicine

## 2013-09-20 ENCOUNTER — Emergency Department (HOSPITAL_COMMUNITY): Payer: Medicare Other

## 2013-09-20 ENCOUNTER — Encounter (HOSPITAL_COMMUNITY): Payer: Self-pay | Admitting: Emergency Medicine

## 2013-09-20 DIAGNOSIS — Z79899 Other long term (current) drug therapy: Secondary | ICD-10-CM | POA: Insufficient documentation

## 2013-09-20 DIAGNOSIS — R296 Repeated falls: Secondary | ICD-10-CM | POA: Insufficient documentation

## 2013-09-20 DIAGNOSIS — Y939 Activity, unspecified: Secondary | ICD-10-CM | POA: Insufficient documentation

## 2013-09-20 DIAGNOSIS — E1149 Type 2 diabetes mellitus with other diabetic neurological complication: Secondary | ICD-10-CM | POA: Insufficient documentation

## 2013-09-20 DIAGNOSIS — E7800 Pure hypercholesterolemia, unspecified: Secondary | ICD-10-CM | POA: Insufficient documentation

## 2013-09-20 DIAGNOSIS — F0390 Unspecified dementia without behavioral disturbance: Secondary | ICD-10-CM

## 2013-09-20 DIAGNOSIS — Z8673 Personal history of transient ischemic attack (TIA), and cerebral infarction without residual deficits: Secondary | ICD-10-CM | POA: Insufficient documentation

## 2013-09-20 DIAGNOSIS — E1142 Type 2 diabetes mellitus with diabetic polyneuropathy: Secondary | ICD-10-CM | POA: Insufficient documentation

## 2013-09-20 DIAGNOSIS — Z8679 Personal history of other diseases of the circulatory system: Secondary | ICD-10-CM | POA: Insufficient documentation

## 2013-09-20 DIAGNOSIS — Z87891 Personal history of nicotine dependence: Secondary | ICD-10-CM | POA: Insufficient documentation

## 2013-09-20 DIAGNOSIS — Y929 Unspecified place or not applicable: Secondary | ICD-10-CM | POA: Insufficient documentation

## 2013-09-20 DIAGNOSIS — Z043 Encounter for examination and observation following other accident: Secondary | ICD-10-CM | POA: Insufficient documentation

## 2013-09-20 DIAGNOSIS — W19XXXA Unspecified fall, initial encounter: Secondary | ICD-10-CM

## 2013-09-20 DIAGNOSIS — Z8669 Personal history of other diseases of the nervous system and sense organs: Secondary | ICD-10-CM | POA: Insufficient documentation

## 2013-09-20 LAB — CBC WITH DIFFERENTIAL/PLATELET
Eosinophils Absolute: 0.1 10*3/uL (ref 0.0–0.7)
HCT: 37.5 % — ABNORMAL LOW (ref 39.0–52.0)
Lymphocytes Relative: 21 % (ref 12–46)
Lymphs Abs: 0.9 10*3/uL (ref 0.7–4.0)
MCV: 92.6 fL (ref 78.0–100.0)
Monocytes Relative: 8 % (ref 3–12)
Neutrophils Relative %: 67 % (ref 43–77)
Platelets: 137 10*3/uL — ABNORMAL LOW (ref 150–400)
RDW: 13.3 % (ref 11.5–15.5)

## 2013-09-20 LAB — BASIC METABOLIC PANEL
CO2: 29 mEq/L (ref 19–32)
Calcium: 8.9 mg/dL (ref 8.4–10.5)
Creatinine, Ser: 0.95 mg/dL (ref 0.50–1.35)
Sodium: 141 mEq/L (ref 137–147)

## 2013-09-20 MED ORDER — SODIUM CHLORIDE 0.9 % IV BOLUS (SEPSIS)
500.0000 mL | Freq: Once | INTRAVENOUS | Status: AC
  Administered 2013-09-20: 500 mL via INTRAVENOUS

## 2013-09-20 NOTE — ED Notes (Signed)
Per report, PTAR advised patient had 2 unwitnessed falls at home this morning.   Patient's wife found him and advised PTAR no injuries that she knew of, but unsure.  Patient history of dementia, and patient refuses to look or talk to healthcare workers.   Patient tries to resist when we try to get vitals.   PTAR advised that patient's gait has been "off" lately, but no other information given.

## 2013-09-20 NOTE — ED Notes (Signed)
Patient will not answer any questions.   Nothing remarkable about overall assessment.   Patient does not appear to be in any distress at this time.

## 2013-09-21 ENCOUNTER — Telehealth: Payer: Self-pay | Admitting: Neurology

## 2013-09-21 DIAGNOSIS — F0391 Unspecified dementia with behavioral disturbance: Secondary | ICD-10-CM

## 2013-09-21 DIAGNOSIS — F03918 Unspecified dementia, unspecified severity, with other behavioral disturbance: Secondary | ICD-10-CM

## 2013-09-21 MED ORDER — ESCITALOPRAM OXALATE 5 MG PO TABS: 10.0000 mg | ORAL_TABLET | Freq: Every day | ORAL | Status: DC

## 2013-09-21 NOTE — ED Provider Notes (Signed)
CSN: [id number]     Arrival date & time 09/20/13  1051 History   First MD Initiated Contact with Patient 09/20/13 1105     Chief Complaint  Patient presents with  . Fall   (Consider location/radiation/quality/duration/timing/severity/associated sxs/prior Treatment) Patient is a 79 y.o. male presenting with fall. The history is provided by a relative (wife states she found him on the floor alert.  pt has no complaints.  he is oriented to person only).  Fall This is a recurrent problem. The current episode started 3 to 5 hours ago. The problem occurs rarely. The problem has been resolved. Pertinent negatives include no chest pain, no abdominal pain and no headaches. Nothing aggravates the symptoms. Nothing relieves the symptoms. He has tried nothing for the symptoms.    Past Medical History  Diagnosis Date  . Stroke   . Diabetes   . High cholesterol   . HBP (high blood pressure)   . Blurred vision   . Lewy body dementia   . Restless leg syndrome   . Diplopia   . Polyneuropathy in diabetes(357.2)   . Hallucinations   . Mild aortic stenosis    Past Surgical History  Procedure Laterality Date  . Hemorrhoid surgery  1950's  . Prostate surgery  2004  . Cataract extraction Bilateral 2010  . Whipple procedure    . Inguinal hernia repair Right    No family history on file. History  Substance Use Topics  . Smoking status: Former Games developer  . Smokeless tobacco: Never Used  . Alcohol Use: No    Review of Systems  Constitutional: Negative for appetite change and fatigue.  HENT: Negative for congestion, ear discharge and sinus pressure.   Eyes: Negative for discharge.  Respiratory: Negative for cough.   Cardiovascular: Negative for chest pain.  Gastrointestinal: Negative for abdominal pain and diarrhea.  Genitourinary: Negative for frequency and hematuria.  Musculoskeletal: Negative for back pain.  Skin: Negative for rash.  Neurological: Negative for seizures and headaches.   Psychiatric/Behavioral: Negative for hallucinations.    Allergies  Zetia  Home Medications   Current Outpatient Rx  Name  Route  Sig  Dispense  Refill  . escitalopram (LEXAPRO) 5 MG tablet   Oral   Take 1 tablet (5 mg total) by mouth daily.   30 tablet   2   . memantine (NAMENDA) 10 MG tablet   Oral   Take 1 tablet (10 mg total) by mouth 2 (two) times daily.   60 tablet   6     PLEASE DISPENSE NAMENDA 10MG  BID WHILE NAMENDA XR  .Marland Kitchen.   . nitroGLYCERIN (NITROSTAT) 0.4 MG SL tablet   Sublingual   Place 0.4 mg under the tongue every 5 (five) minutes as needed for chest pain.         . vitamin E 200 UNIT capsule   Oral   Take 200 Units by mouth daily.          BP 136/71  Pulse 56  Temp(Src) 97.9 F (36.6 C) (Axillary)  Resp 18  SpO2 90% Physical Exam  Constitutional: He appears well-developed.  HENT:  Head: Normocephalic.  Eyes: Conjunctivae and EOM are normal. No scleral icterus.  Neck: Neck supple. No thyromegaly present.  Cardiovascular: Normal rate and regular rhythm.  Exam reveals no gallop and no friction rub.   No murmur heard. Pulmonary/Chest: No stridor. He has no wheezes. He has no rales. He exhibits no tenderness.  Abdominal: He exhibits no distension. There is  no tenderness. There is no rebound.  Musculoskeletal: Normal range of motion. He exhibits no edema.  Lymphadenopathy:    He has no cervical adenopathy.  Neurological: He exhibits normal muscle tone. Coordination normal.  Only oriented to person only.  This is his normal  Skin: No rash noted. No erythema.  Psychiatric: He has a normal mood and affect. His behavior is normal.    ED Course  Procedures (including critical care time) Labs Review Labs Reviewed  CBC WITH DIFFERENTIAL - Abnormal; Notable for the following:    RBC 4.05 (*)    Hemoglobin 12.6 (*)    HCT 37.5 (*)    Platelets 137 (*)    All other components within normal limits  BASIC METABOLIC PANEL - Abnormal; Notable for  the following:    GFR calc non Af Amer 72 (*)    GFR calc Af Amer 84 (*)    All other components within normal limits   Imaging Review Dg Pelvis 1-2 Views  09/20/2013   CLINICAL DATA:  Fall, unresponsive  EXAM: PELVIS - 1-2 VIEW  COMPARISON:  None.  FINDINGS: There is no evidence of pelvic fracture or diastasis. No other pelvic bone lesions are seen. Surgical clips in the pelvis are noted. There is a moderate amount of stool in the descending colon and rectum.  IMPRESSION: No acute osseous injury of the pelvis.   Electronically Signed   By: Elige Ko   On: 09/20/2013 12:07   Ct Head Wo Contrast  09/20/2013   CLINICAL DATA:  Fall, history of dementia  EXAM: CT HEAD WITHOUT CONTRAST  TECHNIQUE: Contiguous axial images were obtained from the base of the skull through the vertex without intravenous contrast.  COMPARISON:  11/20/2009  FINDINGS: No skull fracture is noted. Paranasal sinuses and mastoid air cells are unremarkable. Atherosclerotic calcifications of carotid siphon. Stable cerebral atrophy. Stable periventricular and patchy subcortical chronic white matter disease. No acute cortical infarction. No mass lesion is noted on this unenhanced scan. The gray and white-matter differentiation is preserved. Ventricular size is stable from prior exam. Ventricular size is stable from prior exam.  IMPRESSION: No acute intracranial abnormality. Stable atrophy and chronic white matter disease.   Electronically Signed   By: Natasha Mead M.D.   On: 09/20/2013 12:02    EKG Interpretation   None       MDM   1. Fall, initial encounter   2. Dementia        Benny Lennert, MD 09/21/13 [id number]

## 2013-09-21 NOTE — Telephone Encounter (Signed)
Received call from patients son, Earvin Hansen. He expressed concern that his father is becoming more agitated/aggressive. They have noted some minimal improvement with Lexapro 5mg  daily. Will try to increase Lexapro to 10mg  daily. If no improvement could consider seroquel prn.

## 2013-10-21 ENCOUNTER — Telehealth: Payer: Self-pay | Admitting: Neurology

## 2013-10-21 NOTE — Telephone Encounter (Signed)
Patient's son called to say [facility] and caregiver's are concerned over his decline in health - and they suggest he needs to be seen to for medication reevaluation. Please call son Earvin HansenGerald at [phone].

## 2013-10-21 NOTE — Telephone Encounter (Signed)
I called the patient and I talked with the son. The patient has severe dementia with a Lewy body dementia process. The patient appears to be having episodes of eye opening apraxia, and he'll have episodes of what sound like freezing, unable to walk. The patient is on Lexapro taking 10 mg daily which seems to help some of his agitation, but the patient is sundowning towards the afternoon. The 5 mg tablets could be split dosed, taking 1 in the morning, 1 in the afternoon or evening. The son wants a revisit for him, I'll try to get this set up.

## 2013-10-24 ENCOUNTER — Emergency Department (HOSPITAL_COMMUNITY)
Admission: EM | Admit: 2013-10-24 | Discharge: 2013-10-24 | Disposition: A | Discharge status: Home / Self Care | Payer: Medicare Other | Attending: Emergency Medicine | Admitting: Emergency Medicine

## 2013-10-24 ENCOUNTER — Encounter (HOSPITAL_COMMUNITY): Payer: Self-pay | Admitting: Emergency Medicine

## 2013-10-24 ENCOUNTER — Emergency Department (HOSPITAL_COMMUNITY): Payer: Medicare Other

## 2013-10-24 DIAGNOSIS — S0990XA Unspecified injury of head, initial encounter: Secondary | ICD-10-CM | POA: Insufficient documentation

## 2013-10-24 DIAGNOSIS — Y9389 Activity, other specified: Secondary | ICD-10-CM | POA: Insufficient documentation

## 2013-10-24 DIAGNOSIS — Z8669 Personal history of other diseases of the nervous system and sense organs: Secondary | ICD-10-CM | POA: Insufficient documentation

## 2013-10-24 DIAGNOSIS — E1142 Type 2 diabetes mellitus with diabetic polyneuropathy: Secondary | ICD-10-CM | POA: Insufficient documentation

## 2013-10-24 DIAGNOSIS — Z87891 Personal history of nicotine dependence: Secondary | ICD-10-CM | POA: Insufficient documentation

## 2013-10-24 DIAGNOSIS — I10 Essential (primary) hypertension: Secondary | ICD-10-CM | POA: Insufficient documentation

## 2013-10-24 DIAGNOSIS — Z8673 Personal history of transient ischemic attack (TIA), and cerebral infarction without residual deficits: Secondary | ICD-10-CM | POA: Insufficient documentation

## 2013-10-24 DIAGNOSIS — S0180XA Unspecified open wound of other part of head, initial encounter: Secondary | ICD-10-CM | POA: Insufficient documentation

## 2013-10-24 DIAGNOSIS — W07XXXA Fall from chair, initial encounter: Secondary | ICD-10-CM | POA: Insufficient documentation

## 2013-10-24 DIAGNOSIS — K5900 Constipation, unspecified: Secondary | ICD-10-CM | POA: Insufficient documentation

## 2013-10-24 DIAGNOSIS — E1149 Type 2 diabetes mellitus with other diabetic neurological complication: Secondary | ICD-10-CM | POA: Insufficient documentation

## 2013-10-24 DIAGNOSIS — Z79899 Other long term (current) drug therapy: Secondary | ICD-10-CM | POA: Insufficient documentation

## 2013-10-24 DIAGNOSIS — F0390 Unspecified dementia without behavioral disturbance: Secondary | ICD-10-CM | POA: Insufficient documentation

## 2013-10-24 DIAGNOSIS — Y929 Unspecified place or not applicable: Secondary | ICD-10-CM | POA: Insufficient documentation

## 2013-10-24 DIAGNOSIS — S0181XA Laceration without foreign body of other part of head, initial encounter: Secondary | ICD-10-CM

## 2013-10-24 LAB — CBC WITH DIFFERENTIAL/PLATELET
BASOS ABS: 0 10*3/uL (ref 0.0–0.1)
Basophils Relative: 0 % (ref 0–1)
Eosinophils Absolute: 0 10*3/uL (ref 0.0–0.7)
Eosinophils Relative: 0 % (ref 0–5)
HCT: 36.2 % — ABNORMAL LOW (ref 39.0–52.0)
Hemoglobin: 12 g/dL — ABNORMAL LOW (ref 13.0–17.0)
Lymphocytes Relative: 15 % (ref 12–46)
Lymphs Abs: 0.7 10*3/uL (ref 0.7–4.0)
MCH: 31.3 pg (ref 26.0–34.0)
MCHC: 33.1 g/dL (ref 30.0–36.0)
MCV: 94.3 fL (ref 78.0–100.0)
Monocytes Absolute: 0.4 10*3/uL (ref 0.1–1.0)
Monocytes Relative: 8 % (ref 3–12)
NEUTROS ABS: 3.8 10*3/uL (ref 1.7–7.7)
NEUTROS PCT: 77 % (ref 43–77)
PLATELETS: 132 10*3/uL — AB (ref 150–400)
RBC: 3.84 MIL/uL — ABNORMAL LOW (ref 4.22–5.81)
RDW: 13.9 % (ref 11.5–15.5)
WBC: 5 10*3/uL (ref 4.0–10.5)

## 2013-10-24 LAB — BASIC METABOLIC PANEL
BUN: 25 mg/dL — ABNORMAL HIGH (ref 6–23)
CHLORIDE: 103 meq/L (ref 96–112)
CO2: 28 meq/L (ref 19–32)
Calcium: 8.6 mg/dL (ref 8.4–10.5)
Creatinine, Ser: 1.13 mg/dL (ref 0.50–1.35)
GFR calc Af Amer: 65 mL/min — ABNORMAL LOW (ref >90)
GFR calc non Af Amer: 56 mL/min — ABNORMAL LOW (ref >90)
Glucose, Bld: 76 mg/dL (ref 70–99)
POTASSIUM: 4.2 meq/L (ref 3.7–5.3)
Sodium: 144 mEq/L (ref 137–147)

## 2013-10-24 LAB — URINALYSIS, ROUTINE W REFLEX MICROSCOPIC
Bilirubin Urine: NEGATIVE
GLUCOSE, UA: NEGATIVE mg/dL
Hgb urine dipstick: NEGATIVE
Ketones, ur: NEGATIVE mg/dL
LEUKOCYTES UA: NEGATIVE
NITRITE: NEGATIVE
PH: 6 (ref 5.0–8.0)
Protein, ur: 30 mg/dL — AB
Specific Gravity, Urine: 1.027 (ref 1.005–1.030)
Urobilinogen, UA: 1 mg/dL (ref 0.0–1.0)

## 2013-10-24 LAB — URINE MICROSCOPIC-ADD ON

## 2013-10-24 NOTE — ED Notes (Signed)
Per EMS - pt fell out of recliner chair today. Pt was sitting upon arrival. Family denies LOC. Pt has small hematoma to forehead. Pt's mental status right now is baseline for the pt. Has hx of dementia and is non-communicative. Wife reports pt has fallen 2 other times this weekend. BP 124/86 HR 90 RR 16.

## 2013-10-24 NOTE — ED Notes (Signed)
Pt returned from radiology.

## 2013-10-24 NOTE — ED Notes (Signed)
PTAR at bedside 

## 2013-10-24 NOTE — ED Notes (Signed)
Pt's wife at bedside - reports this is his 3rd fall since the weekend and thinks he has been more confused this weekend. Reports he was in the recliner chair and it was raised up when he tried to get out of it and fell. Pt has had a good appetite, unsure if he had a fever.

## 2013-10-24 NOTE — Telephone Encounter (Signed)
I called the son, he is concerned that his father has a bladder infection, as he has heard that this may alter some with behavior and cause confusion. They may take him to his primary care physician to get this evaluated. I will be seeing the patient on 11/09/2013.

## 2013-10-24 NOTE — ED Provider Notes (Signed)
CSN: [id number]     Arrival date & time 10/24/13  1344 History   First MD Initiated Contact with Patient 10/24/13 1348     Chief Complaint  Patient presents with  . Fall   (Consider location/radiation/quality/duration/timing/severity/associated sxs/prior Treatment) HPI Comments: Patient with history of dementia presents with fall and left forehead hematoma. Patient's wife states that patient tried to get up from his recliner and fell on the floor. Fall was witnessed. Wife states that the patient has been more confused over the past 3 days. He is not in any pain. No fevers. No nausea, vomiting. Normal appetite. Level V caveat due to dementia.   Patient is a 80 y.o. male presenting with fall. The history is provided by the spouse.  Fall Pertinent negatives include no coughing, nausea or vomiting.    Past Medical History  Diagnosis Date  . Stroke   . Diabetes   . High cholesterol   . HBP (high blood pressure)   . Blurred vision   . Lewy body dementia   . Restless leg syndrome   . Diplopia   . Polyneuropathy in diabetes(357.2)   . Hallucinations   . Mild aortic stenosis    Past Surgical History  Procedure Laterality Date  . Hemorrhoid surgery  1950's  . Prostate surgery  2004  . Cataract extraction Bilateral 2010  . Whipple procedure    . Inguinal hernia repair Right    No family history on file. History  Substance Use Topics  . Smoking status: Former Games developer  . Smokeless tobacco: Never Used  . Alcohol Use: No    Review of Systems  Unable to perform ROS: Dementia  Respiratory: Negative for cough and shortness of breath.   Gastrointestinal: Positive for constipation. Negative for nausea, vomiting and diarrhea.    Allergies  Zetia  Home Medications   Current Outpatient Rx  Name  Route  Sig  Dispense  Refill  . escitalopram (LEXAPRO) 5 MG tablet   Oral   Take 2 tablets (10 mg total) by mouth daily.   30 tablet   2   . memantine (NAMENDA) 10 MG tablet   Oral    Take 1 tablet (10 mg total) by mouth 2 (two) times daily.   60 tablet   6     PLEASE DISPENSE NAMENDA 10MG  BID WHILE NAMENDA XR  .Marland Kitchen.   . nitroGLYCERIN (NITROSTAT) 0.4 MG SL tablet   Sublingual   Place 0.4 mg under the tongue every 5 (five) minutes as needed for chest pain.         . vitamin E 200 UNIT capsule   Oral   Take 200 Units by mouth daily.          BP 117/70  Resp 16  SpO2 98% Physical Exam  Nursing note and vitals reviewed. Constitutional: He appears well-developed and well-nourished.  HENT:  Head: Normocephalic.  1cm linear laceration over left eyebrow with small hematoma.   Eyes: Conjunctivae are normal. Right eye exhibits no discharge. Left eye exhibits no discharge.  Neck: Normal range of motion. Neck supple.  Cardiovascular: Normal rate, regular rhythm and normal heart sounds.   Pulmonary/Chest: Effort normal and breath sounds normal.  Abdominal: Soft. There is no tenderness.  Neurological: He is disoriented (baseline dementia).  Skin: Skin is warm and dry.  Psychiatric: He has a normal mood and affect.    ED Course  Procedures (including critical care time) Labs Review Labs Reviewed  CBC WITH DIFFERENTIAL - Abnormal; Notable  for the following:    RBC 3.84 (*)    Hemoglobin 12.0 (*)    HCT 36.2 (*)    Platelets 132 (*)    All other components within normal limits  BASIC METABOLIC PANEL - Abnormal; Notable for the following:    BUN 25 (*)    GFR calc non Af Amer 56 (*)    GFR calc Af Amer 65 (*)    All other components within normal limits  URINALYSIS, ROUTINE W REFLEX MICROSCOPIC - Abnormal; Notable for the following:    Protein, ur 30 (*)    All other components within normal limits  URINE MICROSCOPIC-ADD ON - Abnormal; Notable for the following:    Casts HYALINE CASTS (*)    All other components within normal limits   Imaging Review Ct Head Wo Contrast  10/24/2013   CLINICAL DATA:  Fall  EXAM: CT HEAD WITHOUT CONTRAST  CT CERVICAL SPINE  WITHOUT CONTRAST  TECHNIQUE: Multidetector CT imaging of the head and cervical spine was performed following the standard protocol without intravenous contrast. Multiplanar CT image reconstructions of the cervical spine were also generated.  COMPARISON:  CT HEAD W/O CM dated 09/20/2013; CT CHEST W/CM dated 12/04/2005; DG CHEST 2 VIEW dated 03/05/2009  FINDINGS: CT HEAD FINDINGS  There is no evidence of mass effect, midline shift, or extra-axial fluid collections. There is no evidence of a space-occupying lesion or intracranial hemorrhage. There is no evidence of a cortical-based area of acute infarction. There is generalized cerebral atrophy. There is periventricular white matter low attenuation likely secondary to microangiopathy.  The ventricles and sulci are appropriate for the patient's age. The basal cisterns are patent.  Visualized portions of the orbits are unremarkable. There is mild ethmoid sinus mucosal thickening. There is cerebrovascular atherosclerotic disease.  The osseous structures are unremarkable.  CT CERVICAL SPINE FINDINGS  The alignment is anatomic. The vertebral body heights are maintained. There is no acute fracture. There is no static listhesis. The prevertebral soft tissues are normal. The intraspinal soft tissues are not fully imaged on this examination due to poor soft tissue contrast, but there is no gross soft tissue abnormality.  There is degenerative disc disease throughout the cervical spine. There is facet arthropathy bilaterally at C3-4 and C4-5. There is a mild broad-based disc osteophyte complex that C5-6, C6-7 and C7-T1.  There is a spiculated 13 mm left apical pulmonary nodule. There is biapical scarring. There are biapical emphysematous changes.  IMPRESSION: 1. No acute intracranial pathology. 2. No acute osseous injury of the cervical spine. 3. Indeterminate 13 mm left apical pulmonary nodule which appears slightly enlarged compared with the prior CT chest dated 12/04/2005.  Recommend oncology consultation.   Electronically Signed   By: Elige KoHetal  Patel   On: 10/24/2013 15:20   Ct Cervical Spine Wo Contrast  10/24/2013   CLINICAL DATA:  Fall  EXAM: CT HEAD WITHOUT CONTRAST  CT CERVICAL SPINE WITHOUT CONTRAST  TECHNIQUE: Multidetector CT imaging of the head and cervical spine was performed following the standard protocol without intravenous contrast. Multiplanar CT image reconstructions of the cervical spine were also generated.  COMPARISON:  CT HEAD W/O CM dated 09/20/2013; CT CHEST W/CM dated 12/04/2005; DG CHEST 2 VIEW dated 03/05/2009  FINDINGS: CT HEAD FINDINGS  There is no evidence of mass effect, midline shift, or extra-axial fluid collections. There is no evidence of a space-occupying lesion or intracranial hemorrhage. There is no evidence of a cortical-based area of acute infarction. There is generalized cerebral  atrophy. There is periventricular white matter low attenuation likely secondary to microangiopathy.  The ventricles and sulci are appropriate for the patient's age. The basal cisterns are patent.  Visualized portions of the orbits are unremarkable. There is mild ethmoid sinus mucosal thickening. There is cerebrovascular atherosclerotic disease.  The osseous structures are unremarkable.  CT CERVICAL SPINE FINDINGS  The alignment is anatomic. The vertebral body heights are maintained. There is no acute fracture. There is no static listhesis. The prevertebral soft tissues are normal. The intraspinal soft tissues are not fully imaged on this examination due to poor soft tissue contrast, but there is no gross soft tissue abnormality.  There is degenerative disc disease throughout the cervical spine. There is facet arthropathy bilaterally at C3-4 and C4-5. There is a mild broad-based disc osteophyte complex that C5-6, C6-7 and C7-T1.  There is a spiculated 13 mm left apical pulmonary nodule. There is biapical scarring. There are biapical emphysematous changes.  IMPRESSION: 1. No  acute intracranial pathology. 2. No acute osseous injury of the cervical spine. 3. Indeterminate 13 mm left apical pulmonary nodule which appears slightly enlarged compared with the prior CT chest dated 12/04/2005. Recommend oncology consultation.   Electronically Signed   By: Elige Ko   On: 10/24/2013 15:20    EKG Interpretation   None      2:03 PM Patient seen and examined. Work-up initiated. Patient d/c with Dr. Judd Lien.   Vital signs reviewed and are as follows: Filed Vitals:   10/24/13 1353  BP: 117/70  Resp: 16  BP 117/70  Resp 16  SpO2 98%  3:52 PM Reviewed findings with Dr. Judd Lien who has seen. Temp is high normal for rectal. Dr. Judd Lien agrees patient is okay for discharge.   Results reviewed with patient's wife. Urged PCP f/u.   Facial laceration repaired with dermabond.   Family urged to return patient with worsening symptoms or other concerns. Encouraged PCP f/u. Patient verbalized understanding and agrees with plan.      MDM   1. Head injury   2. Facial laceration   3. Dementia    Patient with mechanical fall. CT neg.   Patient with dementia, more confused per family. No objective findings for infection. Borderline temp. No indication for admission based on this work-up here. No skin rashes. No cough to suggest PNA. Family to monitor closely and return if worsening.   No dangerous or life-threatening conditions suspected or identified by history, physical exam, and by work-up. No indications for hospitalization identified.          Renne Crigler, PA-C 10/24/13 1557

## 2013-10-24 NOTE — Discharge Instructions (Signed)
Please read and follow all provided instructions.  Your diagnoses today include:  1. Head injury   2. Facial laceration   3. Dementia     Tests performed today include:  CT scan of your head and neck that did not show any serious injury.  Blood counts and electrolytes - normal  Urine test - normal  Vital signs. See below for your results today.   Medications prescribed:   None  Take any prescribed medications only as directed.  Home care instructions:  Follow any educational materials contained in this packet.  Follow-up instructions: Please follow-up with your primary care provider in the next 3 days for further evaluation of your symptoms. If you do not have a primary care doctor -- see below for referral information.   Return instructions:  SEEK IMMEDIATE MEDICAL ATTENTION IF:  There is confusion or drowsiness (although children frequently become drowsy after injury).   You cannot awaken the injured person.   You have more than one episode of vomiting.   You notice dizziness or unsteadiness which is getting worse, or inability to walk.   You have convulsions or unconsciousness.   You experience severe, persistent headaches not relieved by Tylenol.  You cannot use arms or legs normally.   There are changes in pupil sizes. (This is the black center in the colored part of the eye)   There is clear or bloody discharge from the nose or ears.   You have change in speech, vision, swallowing, or understanding.   Localized weakness, numbness, tingling, or change in bowel or bladder control.  You have any other emergent concerns.  Additional Information: You have had a head injury which does not appear to require admission at this time.  Your vital signs today were: BP 135/91   Pulse 112   Temp(Src) 101.1 F (38.4 C) (Rectal)   Resp 16   SpO2 90% If your blood pressure (BP) was elevated above 135/85 this visit, please have this repeated by your doctor within  one month. --------------

## 2013-10-24 NOTE — Telephone Encounter (Signed)
Mr. [name]'s son is wondering if he needs to take his father to his primary care for urinalysis, or if it can be done on the 18th here. He thinks its possible that his father has uti.

## 2013-10-24 NOTE — ED Notes (Signed)
Dermabond given to [place], [place]

## 2013-10-25 NOTE — ED Provider Notes (Signed)
Medical screening examination/treatment/procedure(s) were conducted as a shared visit with non-physician practitioner(s) and myself.  I personally evaluated the patient during the encounter. Patient is an elderly male with history of dementia. He is brought for evaluation of fall. He attempted to get out of his recliner and apparently lost his balance and slid onto the floor. He hit his forehead and caused a small laceration above the left eye. There was no reported loss of consciousness. The patient has a history of dementia and adds little additional information. The history is primarily taken through the wife who was present at bedside. According to her, he is at his baseline.  On exam, vitals are stable and the patient is afebrile. There is a small laceration above the left eye. The bleeding is controlled and this is well approximated. Pupils equally round and reactive. Patient moves all 4 extremities, but additional neurologic examination is difficult due to history of dementia. There appears to be no discomfort with palpation of his neck or hips.  CT of the head and cervical spine do not reveal any acute injury. He appears stable for discharge.     Geoffery Lyonsouglas Walter Grima, MD 10/25/13 [phone]

## 2013-10-31 ENCOUNTER — Encounter (INDEPENDENT_AMBULATORY_CARE_PROVIDER_SITE_OTHER): Payer: Self-pay

## 2013-10-31 ENCOUNTER — Encounter: Payer: Self-pay | Admitting: Neurology

## 2013-10-31 ENCOUNTER — Ambulatory Visit (INDEPENDENT_AMBULATORY_CARE_PROVIDER_SITE_OTHER): Payer: Medicare Other | Admitting: Neurology

## 2013-10-31 VITALS — BP 100/68 | HR 66

## 2013-10-31 DIAGNOSIS — G3183 Dementia with Lewy bodies: Principal | ICD-10-CM

## 2013-10-31 DIAGNOSIS — R269 Unspecified abnormalities of gait and mobility: Secondary | ICD-10-CM | POA: Insufficient documentation

## 2013-10-31 DIAGNOSIS — F0280 Dementia in other diseases classified elsewhere without behavioral disturbance: Secondary | ICD-10-CM

## 2013-10-31 HISTORY — DX: Unspecified abnormalities of gait and mobility: R26.9

## 2013-10-31 MED ORDER — ESCITALOPRAM OXALATE 5 MG/5ML PO SOLN: 10.0000 mg | Freq: Two times a day (BID) | ORAL | Status: DC

## 2013-10-31 NOTE — Patient Instructions (Signed)

## 2013-10-31 NOTE — Progress Notes (Signed)
Reason for visit: Dementia  [name] is an 80 y.o. male  History of present illness:  [name] is an 80 year old right-handed black male with a history of a progressive dementing illness over the last 4-5 years. The patient has had a significant decline in within the last 8 months. The patient was taken off of Aricept, and he has had a further decline in the last 6 weeks. 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 The patient is having some agitation. The patient now requires 24/7 assistance for all activities of daily living. The patient is unable to feed himself, but he will swallow the food if fed. The patient is having difficulty taking pills. The patient remains at home with his wife with some caretakers coming in 6 days a week. The patient appears to be hallucinating at times. His verbal output has declined.  Past Medical History  Diagnosis Date  . Stroke   . Diabetes   . High cholesterol   . HBP (high blood pressure)   . Blurred vision   . Lewy body dementia   . Restless leg syndrome   . Diplopia   . Polyneuropathy in diabetes(357.2)   . Hallucinations   . Mild aortic stenosis   . Abnormality of gait 10/31/2013    Past Surgical History  Procedure Laterality Date  . Hemorrhoid surgery  1950's  . Prostate surgery  2004  . Cataract extraction Bilateral 2010  . Whipple procedure    . Inguinal hernia repair Right     History reviewed. No pertinent family history.  Social history:  reports that he has quit smoking. He has never used smokeless tobacco. He reports that he does not drink alcohol or use illicit drugs.    Allergies  Allergen Reactions  . Zetia [Ezetimibe]     cramps    Medications:  Current Outpatient Prescriptions on File Prior to Visit  Medication Sig Dispense Refill  . memantine (NAMENDA) 10 MG tablet Take 1 tablet (10 mg total) by mouth 2 (two) times daily.  60 tablet  6  . vitamin E 200 UNIT  capsule Take 200 Units by mouth daily.      . nitroGLYCERIN (NITROSTAT) 0.4 MG SL tablet Place 0.4 mg under the tongue every 5 (five) minutes as needed for chest pain.       No current facility-administered medications on file prior to visit.    ROS:  Out of a complete 14 system review of symptoms, the patient complains only of the following symptoms, and all other reviewed systems are negative.  Activity change Incontinence of bowel and bladder Frequent waking, acting out dreams Memory loss, speech difficulty, weakness Walking difficulty, coordination problems Agitation, confusion, decreased concentration, anxiety, hallucinations  Blood pressure 100/68, pulse 66, weight 0 lb (0 kg).  Physical Exam  General: The patient is relatively uncooperative, mute.  Skin: No significant peripheral edema is noted.   Neurologic Exam  Mental status: The patient would not cooperate for mental status evaluation.  Cranial nerves: Facial symmetry is present. The patient was mute at the time of examination. The patient sat in a wheelchair with his eyes closed.  Motor: The patient has good strength in all 4 extremities. The patient will resist movements with the arms and legs.  Sensory examination: Unable to test sensation.  Coordination: The patient would not cooperate for cerebellar testing.  Gait and station: The patient would not ambulate.  Reflexes: Deep tendon reflexes  are symmetric.   CT head, cervical spine 10/24/2013:  IMPRESSION:  1. No acute intracranial pathology.  2. No acute osseous injury of the cervical spine.  3. Indeterminate 13 mm left apical pulmonary nodule which appears  slightly enlarged compared with the prior CT chest dated 12/04/2005.  Recommend oncology consultation.    Assessment/Plan:  1. Progressive dementing illness, Lewy body dementia  2. Gait disorder  3. Eye opening apraxia  The patient has had a significant decline in his functional  abilities. The patient at this point may be a candidate for hospice. The patient was given a prescription for a hospital bed and a bedside commode. If the family wishes to get hospice involved, they are to contact our office. The patient will followup in 4 months. The Lexapro will be switched to a liquid form.  Billy Palau. Keith Le Faulcon MD 10/31/2013 7:20 PM  [facility] [address] [place], [place] [id number]  Phone [phone] Fax [phone]

## 2013-11-01 ENCOUNTER — Ambulatory Visit: Payer: Self-pay | Admitting: Neurology

## 2013-11-04 ENCOUNTER — Telehealth: Payer: Self-pay | Admitting: Neurology

## 2013-11-04 DIAGNOSIS — G3183 Dementia with Lewy bodies: Principal | ICD-10-CM

## 2013-11-04 DIAGNOSIS — F0280 Dementia in other diseases classified elsewhere without behavioral disturbance: Secondary | ICD-10-CM

## 2013-11-04 NOTE — Telephone Encounter (Signed)
Called patient and spoke with patient's son Earvin HansenGerald concerning patient starting Hospice. The son stated that Dr. Anne HahnWillis said that when they wanted to start hospice to give our office call. The son would like for Dr. Anne HahnWillis to give him a call concerning this matter. The son can be reached at [phone]. Please advise.

## 2013-11-04 NOTE — Telephone Encounter (Signed)
Please call to start process of Hospice

## 2013-11-04 NOTE — Telephone Encounter (Signed)
The son would like a referral for his father for hospice to help out in the home and back. I'll make this referral. He requests that hospice call him at his cell number at [phone]. He does not want them to call his mother.

## 2013-11-08 ENCOUNTER — Telehealth: Payer: Self-pay | Admitting: Neurology

## 2013-11-08 NOTE — Telephone Encounter (Signed)
Yvette calling from [facility] to ask advice about whether or not Dr. Anne HahnWillis would be the patient's attending physician for hospice. Please call her back and advise.

## 2013-11-08 NOTE — Telephone Encounter (Signed)
I called hospice, I will be happy to be the attending physician for this patient.

## 2013-11-09 ENCOUNTER — Ambulatory Visit: Payer: Self-pay | Admitting: Neurology

## 2013-11-10 ENCOUNTER — Telehealth: Payer: Self-pay | Admitting: Neurology

## 2013-11-10 MED ORDER — ESCITALOPRAM OXALATE 5 MG PO TABS: 5.0000 mg | ORAL_TABLET | Freq: Two times a day (BID) | ORAL | Status: DC

## 2013-11-10 NOTE — Telephone Encounter (Signed)
All changes patient back to the Lexapro taking 5 mg twice daily, the annual crush the tablets.

## 2013-11-10 NOTE — Telephone Encounter (Signed)
Patient wife calling stating that she is having a problem with the patient taking the antidepressant liquid medication. Patient's wife would like to know what can she do to resolve this. Please advise.

## 2013-11-10 NOTE — Telephone Encounter (Signed)
Patient's wife calling to state that they are having trouble with the patient's liquid anti depressant, states that he cannot swallow it, instead he spits it out. Please call patient's wife and advise.

## 2013-11-10 NOTE — Telephone Encounter (Addendum)
[name], admitting nurse for Hospice calling [phone] (till 1700)  about pt.  Asking 2 things:  1)  Pt using lexapro liquid as a mouthwash and not swallowing.    Crushing oral tabs and placing in food/applesauce is option that they would like to try, also clarification of dose.   Was on lexapro 5mg  tab daily, then to 10mg  liquid when changed to liquid.   Please clarify dose.  [facility] [place] [phone]

## 2013-12-06 ENCOUNTER — Telehealth: Payer: Self-pay | Admitting: Neurology

## 2013-12-06 NOTE — Telephone Encounter (Signed)
Events noted, I did not called patient. This patient has end stage dementia.

## 2013-12-06 NOTE — Telephone Encounter (Signed)
FYI--Lexa w/Hospice states yesterday patient turned his wheelchair over and fell out of it--patient got up by himself with no injuries and his wife assisted him back to wheelchair.

## 2013-12-07 ENCOUNTER — Encounter (HOSPITAL_COMMUNITY): Payer: Self-pay | Admitting: Emergency Medicine

## 2013-12-07 ENCOUNTER — Emergency Department (HOSPITAL_COMMUNITY)
Admission: EM | Admit: 2013-12-07 | Discharge: 2013-12-07 | Disposition: A | Discharge status: Home / Self Care | Attending: Emergency Medicine | Admitting: Emergency Medicine

## 2013-12-07 DIAGNOSIS — E1149 Type 2 diabetes mellitus with other diabetic neurological complication: Secondary | ICD-10-CM | POA: Insufficient documentation

## 2013-12-07 DIAGNOSIS — Z79899 Other long term (current) drug therapy: Secondary | ICD-10-CM | POA: Insufficient documentation

## 2013-12-07 DIAGNOSIS — Z87891 Personal history of nicotine dependence: Secondary | ICD-10-CM | POA: Insufficient documentation

## 2013-12-07 DIAGNOSIS — F0390 Unspecified dementia without behavioral disturbance: Secondary | ICD-10-CM | POA: Insufficient documentation

## 2013-12-07 DIAGNOSIS — E1142 Type 2 diabetes mellitus with diabetic polyneuropathy: Secondary | ICD-10-CM | POA: Insufficient documentation

## 2013-12-07 DIAGNOSIS — N390 Urinary tract infection, site not specified: Secondary | ICD-10-CM | POA: Insufficient documentation

## 2013-12-07 DIAGNOSIS — Z8679 Personal history of other diseases of the circulatory system: Secondary | ICD-10-CM | POA: Insufficient documentation

## 2013-12-07 DIAGNOSIS — Z8673 Personal history of transient ischemic attack (TIA), and cerebral infarction without residual deficits: Secondary | ICD-10-CM | POA: Insufficient documentation

## 2013-12-07 LAB — URINALYSIS, ROUTINE W REFLEX MICROSCOPIC
BILIRUBIN URINE: NEGATIVE
Glucose, UA: NEGATIVE mg/dL
KETONES UR: NEGATIVE mg/dL
NITRITE: NEGATIVE
PROTEIN: NEGATIVE mg/dL
SPECIFIC GRAVITY, URINE: 1.015 (ref 1.005–1.030)
UROBILINOGEN UA: 1 mg/dL (ref 0.0–1.0)
pH: 7 (ref 5.0–8.0)

## 2013-12-07 LAB — BASIC METABOLIC PANEL
BUN: 20 mg/dL (ref 6–23)
CALCIUM: 9 mg/dL (ref 8.4–10.5)
CO2: 29 mEq/L (ref 19–32)
Chloride: 99 mEq/L (ref 96–112)
Creatinine, Ser: 0.93 mg/dL (ref 0.50–1.35)
GFR, EST AFRICAN AMERICAN: 84 mL/min — AB (ref >90)
GFR, EST NON AFRICAN AMERICAN: 73 mL/min — AB (ref >90)
Glucose, Bld: 103 mg/dL — ABNORMAL HIGH (ref 70–99)
POTASSIUM: 4.6 meq/L (ref 3.7–5.3)
SODIUM: 137 meq/L (ref 137–147)

## 2013-12-07 LAB — CBC
HEMATOCRIT: 32.6 % — AB (ref 39.0–52.0)
Hemoglobin: 10.8 g/dL — ABNORMAL LOW (ref 13.0–17.0)
MCH: 30.6 pg (ref 26.0–34.0)
MCHC: 33.1 g/dL (ref 30.0–36.0)
MCV: 92.4 fL (ref 78.0–100.0)
Platelets: 188 10*3/uL (ref 150–400)
RBC: 3.53 MIL/uL — ABNORMAL LOW (ref 4.22–5.81)
RDW: 13.7 % (ref 11.5–15.5)
WBC: 5.6 10*3/uL (ref 4.0–10.5)

## 2013-12-07 LAB — URINE MICROSCOPIC-ADD ON

## 2013-12-07 MED ORDER — ESCITALOPRAM OXALATE 10 MG PO TABS
5.0000 mg | ORAL_TABLET | Freq: Two times a day (BID) | ORAL | Status: DC
  Administered 2013-12-07: 5 mg via ORAL
  Filled 2013-12-07: qty 1

## 2013-12-07 MED ORDER — SODIUM CHLORIDE 0.9 % IV BOLUS (SEPSIS)
1000.0000 mL | Freq: Once | INTRAVENOUS | Status: AC
  Administered 2013-12-07: 1000 mL via INTRAVENOUS

## 2013-12-07 MED ORDER — CEPHALEXIN 500 MG PO CAPS: 500.0000 mg | ORAL_CAPSULE | Freq: Four times a day (QID) | ORAL | Status: DC

## 2013-12-07 MED ORDER — MEMANTINE HCL 10 MG PO TABS
10.0000 mg | ORAL_TABLET | Freq: Two times a day (BID) | ORAL | Status: DC
  Administered 2013-12-07: 10 mg via ORAL
  Filled 2013-12-07: qty 1

## 2013-12-07 MED ORDER — DEXTROSE 5 % IV SOLN
1.0000 g | Freq: Once | INTRAVENOUS | Status: AC
  Administered 2013-12-07: 1 g via INTRAVENOUS
  Filled 2013-12-07: qty 10

## 2013-12-07 MED ORDER — DOXYLAMINE SUCCINATE (SLEEP) 25 MG PO TABS
25.0000 mg | ORAL_TABLET | Freq: Every evening | ORAL | Status: DC | PRN
  Filled 2013-12-07 (×2): qty 1

## 2013-12-07 NOTE — ED Provider Notes (Signed)
CSN: [id number]     Arrival date & time 12/07/13  1806 History   First MD Initiated Contact with Patient 12/07/13 1822     Chief Complaint  Patient presents with  . Hypotension     (Consider location/radiation/quality/duration/timing/severity/associated sxs/prior Treatment) HPI A LEVEL 5 CAVEAT PERTAINS DUE TO DEMENTIA Per wife and home health nurse patient was found to have low blood pressure today.  He has had some decrease in responsiveness from his baseline today.  He has continued to have good appetite and has eaten well today.  No fever/chills. No vomiting or change in stool   Past Medical History  Diagnosis Date  . Stroke   . Diabetes   . High cholesterol   . HBP (high blood pressure)   . Blurred vision   . Lewy body dementia   . Restless leg syndrome   . Diplopia   . Polyneuropathy in diabetes(357.2)   . Hallucinations   . Mild aortic stenosis   . Abnormality of gait 10/31/2013   Past Surgical History  Procedure Laterality Date  . Hemorrhoid surgery  1950's  . Prostate surgery  2004  . Cataract extraction Bilateral 2010  . Whipple procedure    . Inguinal hernia repair Right    No family history on file. History  Substance Use Topics  . Smoking status: Former Games developermoker  . Smokeless tobacco: Never Used  . Alcohol Use: No    Review of Systems UNABLE TO OBTAIN ROS DUE TO LEVEL 5 CAVEAT    Allergies  Zetia  Home Medications   Current Outpatient Rx  Name  Route  Sig  Dispense  Refill  . doxylamine, Sleep, (SLEEP AID) 25 MG tablet   Oral   Take 25 mg by mouth at bedtime as needed (sleep).         Marland Kitchen. escitalopram (LEXAPRO) 5 MG tablet   Oral   Take 1 tablet (5 mg total) by mouth 2 (two) times daily.   60 tablet   5   . memantine (NAMENDA) 10 MG tablet   Oral   Take 1 tablet (10 mg total) by mouth 2 (two) times daily.   60 tablet   6     PLEASE DISPENSE NAMENDA 10MG  BID WHILE NAMENDA XR  .Marland Kitchen..   . cephALEXin (KEFLEX) 500 MG capsule   Oral   Take 1  capsule (500 mg total) by mouth 4 (four) times daily.   28 capsule   0   . nitroGLYCERIN (NITROSTAT) 0.4 MG SL tablet   Sublingual   Place 0.4 mg under the tongue every 5 (five) minutes as needed for chest pain.          BP 132/80  Pulse 75  Temp(Src) 98.4 F (36.9 C) (Oral)  Resp 18  SpO2 97% Vitals reviewed Physical Exam Physical Examination: General appearance - alert, chronically ill appearing, and in no distress Mental status - alert, not oriented to person place or time Eyes - pupils equal and reactive, no scleral icterus, no conjunctival injection Mouth - mucous membrane tacky Chest - clear to auscultation, no wheezes, rales or rhonchi, symmetric air entry Heart - normal rate, regular rhythm, normal S1, S2, no murmurs, rubs, clicks or gallops Abdomen - soft, nontender, nondistended, no masses or organomegaly, nabs Extremities - peripheral pulses normal, no pedal edema, no clubbing or cyanosis Skin - normal coloration and turgor, no rashes  ED Course  Procedures (including critical care time) Labs Review Labs Reviewed  CBC - Abnormal; Notable  for the following:    RBC 3.53 (*)    Hemoglobin 10.8 (*)    HCT 32.6 (*)    All other components within normal limits  BASIC METABOLIC PANEL - Abnormal; Notable for the following:    Glucose, Bld 103 (*)    GFR calc non Af Amer 73 (*)    GFR calc Af Amer 84 (*)    All other components within normal limits  URINALYSIS, ROUTINE W REFLEX MICROSCOPIC - Abnormal; Notable for the following:    APPearance CLOUDY (*)    Hgb urine dipstick SMALL (*)    Leukocytes, UA LARGE (*)    All other components within normal limits  URINE MICROSCOPIC-ADD ON - Abnormal; Notable for the following:    Bacteria, UA MANY (*)    All other components within normal limits   Imaging Review No results found.   EKG Interpretation   Date/Time:  Wednesday December 07 2013 20:27:27 EDT Ventricular Rate:  76 PR Interval:  170 QRS Duration: 91 QT  Interval:  423 QTC Calculation: 476 R Axis:   16 Text Interpretation:  Sinus rhythm Abnormal R-wave progression, early  transition Nonspecific T abnormalities, lateral leads Borderline prolonged  QT interval No significant change since last tracing Confirmed by [facility]   MD, MARTHA [phone]) on 12/07/2013 9:15:09 PM      MDM   Final diagnoses:  Urinary tract infection    Pt with hx of dementia presents with decreased responsiveness, had hypotension at home, but BP has been normal here without treatment.  Labs show UTI in urine, pt started on IV rocephin.  Will discharge on po keflex.  Discharged with strict return precautions.  Pt agreeable with plan.    Ethelda Chick, MD 12/07/13 2141

## 2013-12-07 NOTE — Discharge Instructions (Signed)
Return to the ED with any concerns including vomiting, fever, difficulty breathing, abdominal pain, decreased level of alertness/lethargy, or any other alarming symptoms

## 2013-12-07 NOTE — ED Notes (Signed)
Pt up for d/c. Waiting to call [facility] until family and caretaker returns.

## 2013-12-07 NOTE — Progress Notes (Signed)
   CARE MANAGEMENT ED NOTE 12/07/2013  Patient:  [name],[name]   Account Number:  [id number]  Date Initiated:  12/07/2013  Documentation initiated by:  Radford PaxFERRERO,Kenon Delashmit  Subjective/Objective Assessment:   Patient presents to Ed with hypotension.     Subjective/Objective Assessment Detail:   Patient with pmhx of dementia.     Action/Plan:   Action/Plan Detail:   Anticipated DC Date:       Status Recommendation to Physician:   Result of Recommendation:    Other ED Services  Consult Working Plan    DC Planning Services  Other    Choice offered to / List presented to:            Status of service:  Completed, signed off  ED Comments:   ED Comments Detail:  EDCM attempted to spea kto patient regarding home health needs however, patient is confused and there is no family present at bedside.  As per chart, patient lives at home with wife and family.

## 2013-12-07 NOTE — ED Notes (Signed)
Pt from home via [facility] c/o hypotension.  Pt home health aid checked BP and it was 90/56 she called EMS for pt to be evalauted. [facility] BP was 130/70's with multiple attempts. Pt has hx of dementia. Pt lives at home with wife and other family.

## 2013-12-07 NOTE — ED Notes (Addendum)
Pt pulled out IV line

## 2013-12-13 ENCOUNTER — Telehealth: Payer: Self-pay | Admitting: Neurology

## 2013-12-13 NOTE — Telephone Encounter (Signed)
This looks like it should go to the triage pool-was sent to admin pool

## 2013-12-13 NOTE — Telephone Encounter (Signed)
Lexa the Hospice nurse called to give an update to Dr. Anne HahnWillis.  Mr. [name] had a trip to the ER on 3-18 for low BP and was diagnosed with a UTI. He was given Cephalexin and he should be finished with them on 12-14-13.  Mr. [name] also had another fall last night.  He was not injured but non-emergent had to be called to get him back into his bed.  Please call if you have any questions or need additional information.  Thank you

## 2013-12-13 NOTE — Telephone Encounter (Signed)
Events noted, the patient has end-stage dementia, was essentially mute on the last visit. I did not call back the family. The patient clearly is declining in his health.

## 2013-12-15 ENCOUNTER — Emergency Department (HOSPITAL_COMMUNITY)
Admission: EM | Admit: 2013-12-15 | Discharge: 2013-12-15 | Disposition: A | Discharge status: Home / Self Care | Attending: Emergency Medicine | Admitting: Emergency Medicine

## 2013-12-15 ENCOUNTER — Encounter (HOSPITAL_COMMUNITY): Payer: Self-pay | Admitting: Emergency Medicine

## 2013-12-15 ENCOUNTER — Emergency Department (HOSPITAL_COMMUNITY)

## 2013-12-15 DIAGNOSIS — F0280 Dementia in other diseases classified elsewhere without behavioral disturbance: Secondary | ICD-10-CM | POA: Insufficient documentation

## 2013-12-15 DIAGNOSIS — R251 Tremor, unspecified: Secondary | ICD-10-CM | POA: Diagnosis present

## 2013-12-15 DIAGNOSIS — R259 Unspecified abnormal involuntary movements: Secondary | ICD-10-CM | POA: Insufficient documentation

## 2013-12-15 DIAGNOSIS — Z8673 Personal history of transient ischemic attack (TIA), and cerebral infarction without residual deficits: Secondary | ICD-10-CM | POA: Insufficient documentation

## 2013-12-15 DIAGNOSIS — Z87891 Personal history of nicotine dependence: Secondary | ICD-10-CM | POA: Insufficient documentation

## 2013-12-15 DIAGNOSIS — E1149 Type 2 diabetes mellitus with other diabetic neurological complication: Secondary | ICD-10-CM | POA: Insufficient documentation

## 2013-12-15 DIAGNOSIS — G3183 Dementia with Lewy bodies: Secondary | ICD-10-CM

## 2013-12-15 DIAGNOSIS — Z792 Long term (current) use of antibiotics: Secondary | ICD-10-CM | POA: Insufficient documentation

## 2013-12-15 DIAGNOSIS — E1142 Type 2 diabetes mellitus with diabetic polyneuropathy: Secondary | ICD-10-CM | POA: Insufficient documentation

## 2013-12-15 DIAGNOSIS — Z79899 Other long term (current) drug therapy: Secondary | ICD-10-CM | POA: Insufficient documentation

## 2013-12-15 DIAGNOSIS — Z8669 Personal history of other diseases of the nervous system and sense organs: Secondary | ICD-10-CM | POA: Insufficient documentation

## 2013-12-15 DIAGNOSIS — N390 Urinary tract infection, site not specified: Secondary | ICD-10-CM | POA: Insufficient documentation

## 2013-12-15 DIAGNOSIS — I10 Essential (primary) hypertension: Secondary | ICD-10-CM | POA: Insufficient documentation

## 2013-12-15 LAB — COMPREHENSIVE METABOLIC PANEL
ALT: 25 U/L (ref 0–53)
AST: 21 U/L (ref 0–37)
Albumin: 3.1 g/dL — ABNORMAL LOW (ref 3.5–5.2)
Alkaline Phosphatase: 87 U/L (ref 39–117)
BILIRUBIN TOTAL: 0.3 mg/dL (ref 0.3–1.2)
BUN: 20 mg/dL (ref 6–23)
CHLORIDE: 99 meq/L (ref 96–112)
CO2: 28 meq/L (ref 19–32)
Calcium: 8.8 mg/dL (ref 8.4–10.5)
Creatinine, Ser: 0.96 mg/dL (ref 0.50–1.35)
GFR calc Af Amer: 83 mL/min — ABNORMAL LOW (ref >90)
GFR calc non Af Amer: 72 mL/min — ABNORMAL LOW (ref >90)
Glucose, Bld: 98 mg/dL (ref 70–99)
Potassium: 4.6 mEq/L (ref 3.7–5.3)
Sodium: 136 mEq/L — ABNORMAL LOW (ref 137–147)
Total Protein: 6.8 g/dL (ref 6.0–8.3)

## 2013-12-15 LAB — CBC WITH DIFFERENTIAL/PLATELET
Basophils Absolute: 0 10*3/uL (ref 0.0–0.1)
Basophils Relative: 0 % (ref 0–1)
Eosinophils Absolute: 0.1 10*3/uL (ref 0.0–0.7)
Eosinophils Relative: 1 % (ref 0–5)
HEMATOCRIT: 31.3 % — AB (ref 39.0–52.0)
HEMOGLOBIN: 10.4 g/dL — AB (ref 13.0–17.0)
LYMPHS PCT: 15 % (ref 12–46)
Lymphs Abs: 0.7 10*3/uL (ref 0.7–4.0)
MCH: 30.2 pg (ref 26.0–34.0)
MCHC: 33.2 g/dL (ref 30.0–36.0)
MCV: 91 fL (ref 78.0–100.0)
MONOS PCT: 13 % — AB (ref 3–12)
Monocytes Absolute: 0.7 10*3/uL (ref 0.1–1.0)
NEUTROS ABS: 3.6 10*3/uL (ref 1.7–7.7)
NEUTROS PCT: 71 % (ref 43–77)
Platelets: 184 10*3/uL (ref 150–400)
RBC: 3.44 MIL/uL — ABNORMAL LOW (ref 4.22–5.81)
RDW: 13.7 % (ref 11.5–15.5)
WBC: 5.1 10*3/uL (ref 4.0–10.5)

## 2013-12-15 LAB — URINALYSIS, ROUTINE W REFLEX MICROSCOPIC
Bilirubin Urine: NEGATIVE
Glucose, UA: NEGATIVE mg/dL
HGB URINE DIPSTICK: NEGATIVE
Ketones, ur: NEGATIVE mg/dL
Leukocytes, UA: NEGATIVE
Nitrite: NEGATIVE
Protein, ur: NEGATIVE mg/dL
SPECIFIC GRAVITY, URINE: 1.013 (ref 1.005–1.030)
UROBILINOGEN UA: 1 mg/dL (ref 0.0–1.0)
pH: 7 (ref 5.0–8.0)

## 2013-12-15 MED ORDER — SODIUM CHLORIDE 0.9 % IV BOLUS (SEPSIS)
500.0000 mL | Freq: Once | INTRAVENOUS | Status: AC
  Administered 2013-12-15: 500 mL via INTRAVENOUS

## 2013-12-15 NOTE — ED Notes (Signed)
Bed: WA04 Expected date:  Expected time:  Means of arrival:  Comments: EMS/altered mental status

## 2013-12-15 NOTE — ED Notes (Signed)
Per pt's wife, she was concerned because pt was shaking his bilateral arms and she feared he was "working up a seizure" Pt has no hx of seizures. Pt responding to painful stimuli. Wife states pt usually has eyes closed, will respond with few words at times.  Reports that current shaking of arms is decreased from what he was previously doing, but that he usually shakes this much.

## 2013-12-15 NOTE — ED Provider Notes (Signed)
CSN: [id number]     Arrival date & time 12/15/13  1914 History   First MD Initiated Contact with Patient 12/15/13 1928     No chief complaint on file.    (Consider location/radiation/quality/duration/timing/severity/associated sxs/prior Treatment) Patient is a 80 y.o. male presenting with neurologic complaint. The history is provided by the spouse.  Neurologic Problem This is a new problem. The current episode started 1 to 2 hours ago. Episode frequency: intermittently. The problem has not changed since onset.Pertinent negatives include no chest pain, no abdominal pain, no headaches and no shortness of breath. Nothing aggravates the symptoms. Nothing relieves the symptoms. He has tried nothing for the symptoms. The treatment provided no relief.    Past Medical History  Diagnosis Date  . Stroke   . Diabetes   . High cholesterol   . HBP (high blood pressure)   . Blurred vision   . Lewy body dementia   . Restless leg syndrome   . Diplopia   . Polyneuropathy in diabetes(357.2)   . Hallucinations   . Mild aortic stenosis   . Abnormality of gait 10/31/2013   Past Surgical History  Procedure Laterality Date  . Hemorrhoid surgery  1950's  . Prostate surgery  2004  . Cataract extraction Bilateral 2010  . Whipple procedure    . Inguinal hernia repair Right    History reviewed. No pertinent family history. History  Substance Use Topics  . Smoking status: Former Games developer  . Smokeless tobacco: Never Used  . Alcohol Use: No    Review of Systems  Constitutional: Negative for fever.  HENT: Negative for drooling and rhinorrhea.   Eyes: Negative for pain.  Respiratory: Negative for cough and shortness of breath.   Cardiovascular: Negative for chest pain and leg swelling.  Gastrointestinal: Negative for nausea, vomiting, abdominal pain and diarrhea.  Genitourinary: Negative for dysuria and hematuria.  Musculoskeletal: Negative for gait problem and neck pain.  Skin: Negative for color  change.  Neurological: Negative for numbness and headaches.  Hematological: Negative for adenopathy.  Psychiatric/Behavioral: Negative for behavioral problems.  All other systems reviewed and are negative.      Allergies  Zetia  Home Medications   Current Outpatient Rx  Name  Route  Sig  Dispense  Refill  . cephALEXin (KEFLEX) 500 MG capsule   Oral   Take 1 capsule (500 mg total) by mouth 4 (four) times daily.   28 capsule   0   . doxylamine, Sleep, (SLEEP AID) 25 MG tablet   Oral   Take 25 mg by mouth at bedtime as needed (sleep).         Marland Kitchen escitalopram (LEXAPRO) 5 MG tablet   Oral   Take 1 tablet (5 mg total) by mouth 2 (two) times daily.   60 tablet   5   . memantine (NAMENDA) 10 MG tablet   Oral   Take 1 tablet (10 mg total) by mouth 2 (two) times daily.   60 tablet   6     PLEASE DISPENSE NAMENDA 10MG  BID WHILE NAMENDA XR  .Marland Kitchen.   . nitroGLYCERIN (NITROSTAT) 0.4 MG SL tablet   Sublingual   Place 0.4 mg under the tongue every 5 (five) minutes as needed for chest pain.          BP 155/96  Pulse 74  Temp(Src) 97.9 F (36.6 C) (Rectal)  Resp 16  SpO2 96% Physical Exam  Nursing note and vitals reviewed. Constitutional: He appears well-developed and well-nourished.  HENT:  Head: Normocephalic and atraumatic.  Right Ear: External ear normal.  Left Ear: External ear normal.  Nose: Nose normal.  Mouth/Throat: Oropharynx is clear and moist. No oropharyngeal exudate.  Eyes: Conjunctivae and EOM are normal. Pupils are equal, round, and reactive to light.  Neck: Normal range of motion. Neck supple.  Cardiovascular: Normal rate, regular rhythm, normal heart sounds and intact distal pulses.  Exam reveals no gallop and no friction rub.   No murmur heard. Pulmonary/Chest: Effort normal and breath sounds normal. No respiratory distress. He has no wheezes.  Abdominal: Soft. Bowel sounds are normal. He exhibits no distension. There is no tenderness. There is no  rebound and no guarding.  Genitourinary: Penis normal.  Musculoskeletal: Normal range of motion. He exhibits no edema and no tenderness.  Neurological: He is alert. GCS eye subscore is 1. GCS verbal subscore is 2. GCS motor subscore is 6.  Does not follow commands.   Eyes are closed.   Appears to be moving all extremities.   Mild intermittent tremors seen in upper extremities.   Skin: Skin is warm and dry.  Psychiatric: He has a normal mood and affect. His behavior is normal.    ED Course  Procedures (including critical care time) Labs Review Labs Reviewed  CBC WITH DIFFERENTIAL - Abnormal; Notable for the following:    RBC 3.44 (*)    Hemoglobin 10.4 (*)    HCT 31.3 (*)    Monocytes Relative 13 (*)    All other components within normal limits  COMPREHENSIVE METABOLIC PANEL - Abnormal; Notable for the following:    Sodium 136 (*)    Albumin 3.1 (*)    GFR calc non Af Amer 72 (*)    GFR calc Af Amer 83 (*)    All other components within normal limits  URINE CULTURE  URINALYSIS, ROUTINE W REFLEX MICROSCOPIC   Imaging Review Dg Chest 1 View  12/15/2013   CLINICAL DATA:  Altered mental status.  EXAM: CHEST - 1 VIEW  COMPARISON:  Chest x-ray 03/05/2009.  FINDINGS: Lung volumes are low. No consolidative airspace disease. No pleural effusions. No pneumothorax. No pulmonary nodule or mass noted. Pulmonary vasculature and the cardiomediastinal silhouette are within normal limits. Ectasia and atherosclerosis of the thoracic aorta (thoracic aortic arch estimated to measure approximately 4 cm in diameter).  IMPRESSION: 1. Low lung volumes without radiographic evidence of acute cardiopulmonary disease. 2. Ectasia and atherosclerosis of the thoracic aorta, similar prior studies.   Electronically Signed   By: Trudie Reed M.D.   On: 12/15/2013 21:02   Ct Head Wo Contrast  12/15/2013   CLINICAL DATA:  Extremity shaking.  EXAM: CT HEAD WITHOUT CONTRAST  TECHNIQUE: Contiguous axial images  were obtained from the base of the skull through the vertex without intravenous contrast.  COMPARISON:  CT HEAD W/O CM dated 10/24/2013; MR HEAD W/O CM dated 11/20/2009  FINDINGS: Images are mildly to moderately degraded by motion. Moderate cerebral atrophy is unchanged. Periventricular white matter hypodensities do not appear significantly changed and are compatible with moderate chronic small vessel ischemic disease. There is no evidence of acute cortical infarct, mass, midline shift, intracranial hemorrhage, or extra-axial fluid collection.  Prior bilateral cataract surgery is noted. The visualized paranasal sinuses and mastoid air cells are clear.  IMPRESSION: No evidence of acute intracranial abnormality.   Electronically Signed   By: Sebastian Ache   On: 12/15/2013 21:47     EKG Interpretation   Date/Time:  Thursday December 15 2013 19:48:09 EDT Ventricular Rate:  73 PR Interval:  173 QRS Duration: 88 QT Interval:  402 QTC Calculation: 443 R Axis:   9 Text Interpretation:  Sinus rhythm Posterior infarct, old No significant  change since last tracing Confirmed by Timoteo Carreiro  MD, Dequann Vandervelden ([id number]) on  12/15/2013 7:58:12 PM      MDM   Final diagnoses:  Tremulousness    7:51 PM 80 y.o. male with a history of diabetes, hypertension, Lewy body dementia and recent diagnosis of UTI on Keflex who presents with altered mental status. The wife notes that approx 1-2 hours ago she noted that he was having some intermittent shaking movements of his upper extremities while sitting in his recliner. She states that it lasted approximately 10 minutes. She notes that he has been well otherwise and tolerating oral intake well. He has some intermittent tremors of his upper extremities on exam but this does not appear to be seizure-like activity. His wife notes they are similar to what she saw at home. He otherwise has his eyes closed and is not following commands. She notes that he appears to be at baseline. He was  recently seen here for a UTI and sent home on Keflex which he is currently still taking. There is no urine culture to followup on. He is afebrile and vital signs are unremarkable here. Will get screening labs and imaging.  10:17 PM: Pt making purposeful movements on exam, muttering. Increased activity from previous eval. I interpreted/reviewed the labs and/or imaging which were non-contributory.  Again, low suspicion for seizure. Family comfortable taking pt home. I have discussed the diagnosis/risks/treatment options with the family and caregiver and believe the pt to be eligible for discharge home to follow-up with pcp next week. We also discussed returning to the ED immediately if new or worsening sx occur. We discussed the sx which are most concerning (e.g., further episodes, fever, vomiting, abd pain) that necessitate immediate return. Medications administered to the patient during their visit and any new prescriptions provided to the patient are listed below.  Medications given during this visit Medications  sodium chloride 0.9 % bolus 500 mL (0 mLs Intravenous Stopped 12/15/13 2214)    New Prescriptions   No medications on file     Junius ArgyleForrest S Esterlene Atiyeh, MD 12/15/13 2303

## 2013-12-15 NOTE — ED Notes (Signed)
Pt to CT at this time.

## 2013-12-15 NOTE — ED Notes (Signed)
Per EMS,  Family called, 1 hour ago pt was A&O at baseline, now he is altered. Pt has hx of dementia.  Pt not answering questions. Pt seems to only respond to pain.  Pt was seen on the 18th for UTI and is on keflex. Family does not notice anything different with urine.

## 2013-12-16 LAB — URINE CULTURE
Colony Count: NO GROWTH
Culture: NO GROWTH

## 2013-12-29 ENCOUNTER — Telehealth: Payer: Self-pay | Admitting: Neurology

## 2013-12-29 ENCOUNTER — Emergency Department (HOSPITAL_COMMUNITY)

## 2013-12-29 ENCOUNTER — Inpatient Hospital Stay (HOSPITAL_COMMUNITY)
Admission: EM | Admit: 2013-12-29 | Discharge: 2013-12-31 | DRG: 087 | Disposition: A | Discharge status: Hospice | Attending: Internal Medicine | Admitting: Internal Medicine

## 2013-12-29 DIAGNOSIS — R251 Tremor, unspecified: Secondary | ICD-10-CM

## 2013-12-29 DIAGNOSIS — Y92009 Unspecified place in unspecified non-institutional (private) residence as the place of occurrence of the external cause: Secondary | ICD-10-CM

## 2013-12-29 DIAGNOSIS — G544 Lumbosacral root disorders, not elsewhere classified: Secondary | ICD-10-CM

## 2013-12-29 DIAGNOSIS — G609 Hereditary and idiopathic neuropathy, unspecified: Secondary | ICD-10-CM

## 2013-12-29 DIAGNOSIS — R269 Unspecified abnormalities of gait and mobility: Secondary | ICD-10-CM

## 2013-12-29 DIAGNOSIS — R5381 Other malaise: Secondary | ICD-10-CM | POA: Diagnosis present

## 2013-12-29 DIAGNOSIS — W050XXA Fall from non-moving wheelchair, initial encounter: Secondary | ICD-10-CM | POA: Diagnosis present

## 2013-12-29 DIAGNOSIS — R413 Other amnesia: Secondary | ICD-10-CM

## 2013-12-29 DIAGNOSIS — R030 Elevated blood-pressure reading, without diagnosis of hypertension: Secondary | ICD-10-CM | POA: Diagnosis present

## 2013-12-29 DIAGNOSIS — R42 Dizziness and giddiness: Secondary | ICD-10-CM

## 2013-12-29 DIAGNOSIS — Z66 Do not resuscitate: Secondary | ICD-10-CM | POA: Diagnosis present

## 2013-12-29 DIAGNOSIS — IMO0002 Reserved for concepts with insufficient information to code with codable children: Secondary | ICD-10-CM | POA: Diagnosis present

## 2013-12-29 DIAGNOSIS — S065XAA Traumatic subdural hemorrhage with loss of consciousness status unknown, initial encounter: Principal | ICD-10-CM | POA: Diagnosis present

## 2013-12-29 DIAGNOSIS — R209 Unspecified disturbances of skin sensation: Secondary | ICD-10-CM

## 2013-12-29 DIAGNOSIS — E1142 Type 2 diabetes mellitus with diabetic polyneuropathy: Secondary | ICD-10-CM | POA: Diagnosis present

## 2013-12-29 DIAGNOSIS — Z87891 Personal history of nicotine dependence: Secondary | ICD-10-CM

## 2013-12-29 DIAGNOSIS — H532 Diplopia: Secondary | ICD-10-CM

## 2013-12-29 DIAGNOSIS — G2581 Restless legs syndrome: Secondary | ICD-10-CM | POA: Diagnosis present

## 2013-12-29 DIAGNOSIS — Z9849 Cataract extraction status, unspecified eye: Secondary | ICD-10-CM

## 2013-12-29 DIAGNOSIS — F068 Other specified mental disorders due to known physiological condition: Secondary | ICD-10-CM

## 2013-12-29 DIAGNOSIS — I6200 Nontraumatic subdural hemorrhage, unspecified: Secondary | ICD-10-CM

## 2013-12-29 DIAGNOSIS — E1149 Type 2 diabetes mellitus with other diabetic neurological complication: Secondary | ICD-10-CM | POA: Diagnosis present

## 2013-12-29 DIAGNOSIS — Z8673 Personal history of transient ischemic attack (TIA), and cerebral infarction without residual deficits: Secondary | ICD-10-CM

## 2013-12-29 DIAGNOSIS — E7800 Pure hypercholesterolemia, unspecified: Secondary | ICD-10-CM | POA: Diagnosis present

## 2013-12-29 DIAGNOSIS — F0280 Dementia in other diseases classified elsewhere without behavioral disturbance: Secondary | ICD-10-CM

## 2013-12-29 DIAGNOSIS — I6789 Other cerebrovascular disease: Secondary | ICD-10-CM

## 2013-12-29 DIAGNOSIS — I350 Nonrheumatic aortic (valve) stenosis: Secondary | ICD-10-CM

## 2013-12-29 DIAGNOSIS — S065X9A Traumatic subdural hemorrhage with loss of consciousness of unspecified duration, initial encounter: Secondary | ICD-10-CM | POA: Diagnosis present

## 2013-12-29 DIAGNOSIS — G3183 Dementia with Lewy bodies: Secondary | ICD-10-CM

## 2013-12-29 DIAGNOSIS — G479 Sleep disorder, unspecified: Secondary | ICD-10-CM

## 2013-12-29 LAB — CBC WITH DIFFERENTIAL/PLATELET
Basophils Absolute: 0 10*3/uL (ref 0.0–0.1)
Basophils Relative: 0 % (ref 0–1)
Eosinophils Absolute: 0 10*3/uL (ref 0.0–0.7)
Eosinophils Relative: 1 % (ref 0–5)
HCT: 33.8 % — ABNORMAL LOW (ref 39.0–52.0)
Hemoglobin: 11.3 g/dL — ABNORMAL LOW (ref 13.0–17.0)
LYMPHS ABS: 0.9 10*3/uL (ref 0.7–4.0)
Lymphocytes Relative: 18 % (ref 12–46)
MCH: 30.8 pg (ref 26.0–34.0)
MCHC: 33.4 g/dL (ref 30.0–36.0)
MCV: 92.1 fL (ref 78.0–100.0)
Monocytes Absolute: 0.8 10*3/uL (ref 0.1–1.0)
Monocytes Relative: 15 % — ABNORMAL HIGH (ref 3–12)
NEUTROS PCT: 66 % (ref 43–77)
Neutro Abs: 3.5 10*3/uL (ref 1.7–7.7)
PLATELETS: 148 10*3/uL — AB (ref 150–400)
RBC: 3.67 MIL/uL — ABNORMAL LOW (ref 4.22–5.81)
RDW: 13.6 % (ref 11.5–15.5)
WBC: 5.2 10*3/uL (ref 4.0–10.5)

## 2013-12-29 LAB — BASIC METABOLIC PANEL
BUN: 16 mg/dL (ref 6–23)
CO2: 23 meq/L (ref 19–32)
Calcium: 8.9 mg/dL (ref 8.4–10.5)
Chloride: 101 mEq/L (ref 96–112)
Creatinine, Ser: 0.78 mg/dL (ref 0.50–1.35)
GFR calc Af Amer: >90 mL/min (ref >90)
GFR calc non Af Amer: 78 mL/min — ABNORMAL LOW (ref >90)
GLUCOSE: 92 mg/dL (ref 70–99)
Potassium: 4.3 mEq/L (ref 3.7–5.3)
Sodium: 139 mEq/L (ref 137–147)

## 2013-12-29 LAB — URINALYSIS, ROUTINE W REFLEX MICROSCOPIC
Bilirubin Urine: NEGATIVE
Glucose, UA: NEGATIVE mg/dL
HGB URINE DIPSTICK: NEGATIVE
Ketones, ur: NEGATIVE mg/dL
Nitrite: NEGATIVE
Protein, ur: NEGATIVE mg/dL
SPECIFIC GRAVITY, URINE: 1.021 (ref 1.005–1.030)
Urobilinogen, UA: 1 mg/dL (ref 0.0–1.0)
pH: 6.5 (ref 5.0–8.0)

## 2013-12-29 LAB — URINE MICROSCOPIC-ADD ON

## 2013-12-29 MED ORDER — LORAZEPAM 2 MG/ML IJ SOLN
1.0000 mg | Freq: Four times a day (QID) | INTRAMUSCULAR | Status: DC | PRN
  Administered 2013-12-29 – 2013-12-30 (×2): 1 mg via INTRAVENOUS
  Filled 2013-12-29 (×2): qty 1

## 2013-12-29 MED ORDER — SODIUM CHLORIDE 0.9 % IJ SOLN
3.0000 mL | Freq: Two times a day (BID) | INTRAMUSCULAR | Status: DC
  Administered 2013-12-30 (×2): 3 mL via INTRAVENOUS

## 2013-12-29 MED ORDER — SODIUM CHLORIDE 0.9 % IV SOLN: 250.0000 mL | INTRAVENOUS | Status: DC | PRN

## 2013-12-29 MED ORDER — SODIUM CHLORIDE 0.9 % IJ SOLN: 3.0000 mL | INTRAMUSCULAR | Status: DC | PRN

## 2013-12-29 NOTE — ED Notes (Signed)
Admitting MD at BS.  

## 2013-12-29 NOTE — ED Notes (Signed)
Attempted to give report 

## 2013-12-29 NOTE — Telephone Encounter (Signed)
Events noted. The patient is being admitted to the hospital.  CT scan head 12/29/2013:  IMPRESSION:  1. New right side extra-axial collection measuring up to 11 mm in  thickness favored to be a hyperacute subdural hematoma. Associated  mild mass effect on the right lateral ventricle and trace leftward  midline shift.  2. Trace intraventricular hemorrhage. No ventriculomegaly.  3. No acute fracture or listhesis identified in the cervical spine.  Ligamentous injury is not excluded.

## 2013-12-29 NOTE — Telephone Encounter (Signed)
Spoke with Nurse and she said that they had taken patient to [facility],not very responsive, leaning to the left ,hemotoma from fall, will keep Dr Anne HahnWillis informed

## 2013-12-29 NOTE — ED Notes (Signed)
Please call hospice with any updates:   [phone]

## 2013-12-29 NOTE — ED Provider Notes (Signed)
CSN: [id number]     Arrival date & time 12/29/13  1335 History   First MD Initiated Contact with Patient 12/29/13 1342     Chief Complaint  Patient presents with  . Fall     (Consider location/radiation/quality/duration/timing/severity/associated sxs/prior Treatment) Patient is a 80 y.o. male presenting with fall. The history is provided by the spouse.  Fall   He is here for evaluation of altered mental status that began this morning. This occurred after a fall last night when he injured his right forehead. At baseline, he has severe dementia. He is unable to give any history.  Level V caveat- dementia   Past Medical History  Diagnosis Date  . Stroke   . Diabetes   . High cholesterol   . HBP (high blood pressure)   . Blurred vision   . Lewy body dementia   . Restless leg syndrome   . Diplopia   . Polyneuropathy in diabetes(357.2)   . Hallucinations   . Mild aortic stenosis   . Abnormality of gait 10/31/2013   Past Surgical History  Procedure Laterality Date  . Hemorrhoid surgery  1950's  . Prostate surgery  2004  . Cataract extraction Bilateral 2010  . Whipple procedure    . Inguinal hernia repair Right    No family history on file. History  Substance Use Topics  . Smoking status: Former Games developer  . Smokeless tobacco: Never Used  . Alcohol Use: No    Review of Systems  Unable to perform ROS     Allergies  Zetia  Home Medications   Current Outpatient Rx  Name  Route  Sig  Dispense  Refill  . Doxylamine Succinate, Sleep, (SLEEP AID PO)   Oral   Take 1 tablet by mouth at bedtime as needed (sleep).         Marland Kitchen escitalopram (LEXAPRO) 5 MG tablet   Oral   Take 1 tablet (5 mg total) by mouth 2 (two) times daily.   60 tablet   5   . LORazepam (ATIVAN) 0.5 MG tablet   Oral   Take 0.5 mg by mouth every 6 (six) hours as needed (restlessness.).         Marland Kitchen memantine (NAMENDA) 10 MG tablet   Oral   Take 1 tablet (10 mg total) by mouth 2 (two) times daily.    60 tablet   6     PLEASE DISPENSE NAMENDA 10MG  BID WHILE NAMENDA XR  .Marland Kitchen.   . nitroGLYCERIN (NITROSTAT) 0.4 MG SL tablet   Sublingual   Place 0.4 mg under the tongue every 5 (five) minutes as needed for chest pain.          BP 111/67  Pulse 74  Temp(Src) 98.1 F (36.7 C) (Rectal)  Resp 18  SpO2 93% Physical Exam  Nursing note and vitals reviewed. Constitutional: He appears well-developed.  Elderly, frail  HENT:  Head: Normocephalic and atraumatic.  Right Ear: External ear normal.  Left Ear: External ear normal.  Eyes: Conjunctivae and EOM are normal. Pupils are equal, round, and reactive to light.  Neck: Normal range of motion and phonation normal. Neck supple.  Cardiovascular: Normal rate, regular rhythm, normal heart sounds and intact distal pulses.   Pulmonary/Chest: Effort normal and breath sounds normal. He exhibits no bony tenderness.  Abdominal: Soft. There is no tenderness.  Musculoskeletal: Normal range of motion.  Neurological: He is alert. No sensory deficit.  Does not follow commands. Symmetric increased tone in both arms, and  legs.  Skin: Skin is warm, dry and intact.  Psychiatric:  Mildly agitated    ED Course  Procedures (including critical care time)  Medications - No data to display  Patient Vitals for the past 24 hrs:  BP Temp Temp src Pulse Resp SpO2  12/29/13 1600 111/67 mmHg - - 74 18 93 %  12/29/13 1558 108/53 mmHg - - 81 21 100 %  12/29/13 1517 110/72 mmHg - - 86 17 100 %  12/29/13 1444 - 98.1 F (36.7 C) Rectal - - -  12/29/13 1430 97/78 mmHg - - 81 18 100 %  12/29/13 1400 113/83 mmHg - - 82 17 100 %  12/29/13 1350 115/76 mmHg 97.7 F (36.5 C) - 81 18 100 %   16:25- I discussed the case with neurosurgeon, Dr. Gerlene FeeKritzer. She states that this patient is not a surgical candidate secondary to his severe baseline dementia. He asked that the patient be admitted by a hospitalist, and he is available for consultation, if needed.  4:27 PM  Reevaluation with update and discussion. After initial assessment and treatment, an updated evaluation reveals discuss case with family members who are at the bedside. They state that he does not have end-of-life issues, discussed or a DO NOT RESUSCITATE status made. Flint MelterElliott L Cariah Salatino   16:31-I discussed the case with the hospitalist, who will see the patient in the ED, and arrange for evaluation.  CRITICAL CARE Performed by: Flint MelterElliott L Jayln Madeira Total critical care time: 35 min. Critical care time was exclusive of separately billable procedures and treating other patients. Critical care was necessary to treat or prevent imminent or life-threatening deterioration. Critical care was time spent personally by me on the following activities: development of treatment plan with patient and/or surrogate as well as nursing, discussions with consultants, evaluation of patient's response to treatment, examination of patient, obtaining history from patient or surrogate, ordering and performing treatments and interventions, ordering and review of laboratory studies, ordering and review of radiographic studies, pulse oximetry and re-evaluation of patient's condition.   Labs Review Labs Reviewed  CBC WITH DIFFERENTIAL - Abnormal; Notable for the following:    RBC 3.67 (*)    Hemoglobin 11.3 (*)    HCT 33.8 (*)    Platelets 148 (*)    Monocytes Relative 15 (*)    All other components within normal limits  BASIC METABOLIC PANEL - Abnormal; Notable for the following:    GFR calc non Af Amer 78 (*)    All other components within normal limits  URINALYSIS, ROUTINE W REFLEX MICROSCOPIC - Abnormal; Notable for the following:    Leukocytes, UA TRACE (*)    All other components within normal limits  URINE CULTURE  URINE MICROSCOPIC-ADD ON   Imaging Review Ct Head Wo Contrast  12/29/2013   CLINICAL DATA:  80 year old male status post fall yesterday but now with altered mental status. Initial encounter.  EXAM: CT  HEAD WITHOUT CONTRAST  CT CERVICAL SPINE WITHOUT CONTRAST  TECHNIQUE: Multidetector CT imaging of the head and cervical spine was performed following the standard protocol without intravenous contrast. Multiplanar CT image reconstructions of the cervical spine were also generated.  COMPARISON:  Head CT without contrast 12/15/2013 and head and cervical spine CT 10/24/2013.  FINDINGS: CT HEAD FINDINGS  Less motion artifact today. No scalp hematoma identified. Negative orbits soft tissues. Visualized paranasal sinuses and mastoids are clear. No skull fracture identified. Calcified atherosclerosis at the skull base.  New low-density extra-axial collection on the right  measuring up to 11 mm in thickness. Associated trace leftward midline shift. A tiny amount of hyperdense blood products are identified associated with this low-density collection (series 2, image 13). Furthermore, there is trace hemorrhage in the apex of the third ventricle (series 2, image 16), also new. No layering intraventricular hemorrhage. Mild effacement of the right lateral ventricle. No ventriculomegaly. Basilar cisterns are patent.  No definite parenchymal contusion. Stable gray-white matter differentiation throughout the brain. No evidence of cortically based acute infarction identified.  CT CERVICAL SPINE FINDINGS  Stable straightening of cervical lordosis. Visualized skull base is intact. No atlanto-occipital dissociation. Bilateral posterior element alignment is within normal limits. Chronic C4-C5 interbody arthrodesis. Multilevel degenerative endplate spurring. Cervicothoracic junction alignment is within normal limits. No acute cervical spine fracture identified. Motion artifact in the visualized upper chest. Grossly intact visualized upper thoracic levels. Stable lung apices.  IMPRESSION: 1. New right side extra-axial collection measuring up to 11 mm in thickness favored to be a hyperacute subdural hematoma. Associated mild mass effect on  the right lateral ventricle and trace leftward midline shift. 2. Trace intraventricular hemorrhage.  No ventriculomegaly. 3. No acute fracture or listhesis identified in the cervical spine. Ligamentous injury is not excluded. Study discussed by telephone with Dr. Mancel Bale on 12/29/2013 at 15:43 .   Electronically Signed   By: Augusto Gamble M.D.   On: 12/29/2013 15:45   Ct Cervical Spine Wo Contrast  12/29/2013   CLINICAL DATA:  79 year old male status post fall yesterday but now with altered mental status. Initial encounter.  EXAM: CT HEAD WITHOUT CONTRAST  CT CERVICAL SPINE WITHOUT CONTRAST  TECHNIQUE: Multidetector CT imaging of the head and cervical spine was performed following the standard protocol without intravenous contrast. Multiplanar CT image reconstructions of the cervical spine were also generated.  COMPARISON:  Head CT without contrast 12/15/2013 and head and cervical spine CT 10/24/2013.  FINDINGS: CT HEAD FINDINGS  Less motion artifact today. No scalp hematoma identified. Negative orbits soft tissues. Visualized paranasal sinuses and mastoids are clear. No skull fracture identified. Calcified atherosclerosis at the skull base.  New low-density extra-axial collection on the right measuring up to 11 mm in thickness. Associated trace leftward midline shift. A tiny amount of hyperdense blood products are identified associated with this low-density collection (series 2, image 13). Furthermore, there is trace hemorrhage in the apex of the third ventricle (series 2, image 16), also new. No layering intraventricular hemorrhage. Mild effacement of the right lateral ventricle. No ventriculomegaly. Basilar cisterns are patent.  No definite parenchymal contusion. Stable gray-white matter differentiation throughout the brain. No evidence of cortically based acute infarction identified.  CT CERVICAL SPINE FINDINGS  Stable straightening of cervical lordosis. Visualized skull base is intact. No atlanto-occipital  dissociation. Bilateral posterior element alignment is within normal limits. Chronic C4-C5 interbody arthrodesis. Multilevel degenerative endplate spurring. Cervicothoracic junction alignment is within normal limits. No acute cervical spine fracture identified. Motion artifact in the visualized upper chest. Grossly intact visualized upper thoracic levels. Stable lung apices.  IMPRESSION: 1. New right side extra-axial collection measuring up to 11 mm in thickness favored to be a hyperacute subdural hematoma. Associated mild mass effect on the right lateral ventricle and trace leftward midline shift. 2. Trace intraventricular hemorrhage.  No ventriculomegaly. 3. No acute fracture or listhesis identified in the cervical spine. Ligamentous injury is not excluded. Study discussed by telephone with Dr. Mancel Bale on 12/29/2013 at 15:43 .   Electronically Signed   By: Si Gaul.D.  On: 12/29/2013 15:45     EKG Interpretation   Date/Time:  Thursday December 29 2013 13:53:34 EDT Ventricular Rate:  82 PR Interval:  155 QRS Duration: 95 QT Interval:  376 QTC Calculation: 439 R Axis:   14 Text Interpretation:  Sinus rhythm since last tracing no significant  change Confirmed by Marlaina Coburn  MD, Mechele Collin ([id number]) on 12/29/2013 2:33:34 PM      MDM   Final diagnoses:  Subdural hematoma    Fall with secondary subdural hematoma, and debilitated baseline state related to Lewy body dementia. The patient does not appear to be a surgical candidate. He'll need to be admitted because of change in mental status, and new care needs.  Nursing Notes Reviewed/ Care Coordinated, and agree without changes. Applicable Imaging Reviewed.  Interpretation of Laboratory Data incorporated into ED treatment    Flint Melter, MD 12/29/13 [phone]

## 2013-12-29 NOTE — ED Notes (Signed)
MD at bedside. 

## 2013-12-29 NOTE — Progress Notes (Signed)
[facility]  [facility]-Karen Merilynn FinlandRobertson RN  Related ER visit after fall at home on 4/8. [facility] diagnosis of Lewy Body Dementia  Pt is Full Code at home. Patient lying supine on stretcher, bandaid to right temple, nonverbal, appeared awake, picking at sheets, moving his arms and mouth.  Wife, cousin and caregiver present. Awaiting test results, work up ongoing. Advised that [facility] will continue to follow patient. Sp[facility] with Hospital staff RN Beck[facility], [facility] on call number given for any updates.  Patient's home medication list is on shadow chart.   Please call [facility] @ [phone] with any hospice needs.   Thank you. Hansel StarlingKaren E. [name], RN  [facility]  Hospice Liaison

## 2013-12-29 NOTE — H&P (Signed)
[facility] History and Physical  BOBIE KISTLER WUJ:[id number] DOB: 24-Apr-1923 DOA: 12/29/2013  Referring physician: Gray Bernhardt PCP: Burtis Junes, MD  Neurologist: Stephanie Acre  Chief Complaint: Brought by family after a fall last night- subsequently very agitated   HPI: [name] is a 80 y.o. male with PMH of lewy body dementia, end stage on hospice at home who fell from his wheelchair and hit his head on the floor. He developed a lump over the right eye, was quite agitated all night despite oral Ativan and then lethargic around 11 PM. He is found to have a subdural hematoma- right sided 11 mm with leftward shift and mild mass effect on right lateral ventricle. He is total care and non-verbal at home and has an aid during the day. His wife takes care of him at night.    Review of Systems: unable to obtain    Past Medical History  Diagnosis Date  . Stroke   . Diabetes   . High cholesterol   . HBP (high blood pressure)   . Blurred vision   . Lewy body dementia   . Restless leg syndrome   . Diplopia   . Polyneuropathy in diabetes(357.2)   . Hallucinations   . Mild aortic stenosis   . Abnormality of gait 10/31/2013   Past Surgical History  Procedure Laterality Date  . Hemorrhoid surgery  1950's  . Prostate surgery  2004  . Cataract extraction Bilateral 2010  . Whipple procedure    . Inguinal hernia repair Right    Social History:  reports that he has quit smoking. He has never used smokeless tobacco. He reports that he does not drink alcohol or use illicit drugs.  Allergies  Allergen Reactions  . Zetia [Ezetimibe]     cramps    No family history on file.   Prior to Admission medications   Medication Sig Start Date End Date Taking? Authorizing Provider  Doxylamine Succinate, Sleep, (SLEEP AID PO) Take 1 tablet by mouth at bedtime as needed (sleep).   Yes Historical Provider, MD  escitalopram (LEXAPRO) 5 MG tablet Take 1 tablet (5 mg total)  by mouth 2 (two) times daily. 11/10/13  Yes York Spaniel, MD  LORazepam (ATIVAN) 0.5 MG tablet Take 0.5 mg by mouth every 6 (six) hours as needed (restlessness.).   Yes Historical Provider, MD  memantine (NAMENDA) 10 MG tablet Take 1 tablet (10 mg total) by mouth 2 (two) times daily. 06/06/13  Yes York Spaniel, MD  nitroGLYCERIN (NITROSTAT) 0.4 MG SL tablet Place 0.4 mg under the tongue every 5 (five) minutes as needed for chest pain.   Yes Historical Provider, MD   Physical Exam: Filed Vitals:   12/29/13 1630  BP: 106/66  Pulse: 82  Temp:   Resp: 13    BP 106/66  Pulse 82  Temp(Src) 98.1 F (36.7 C) (Rectal)  Resp 13  SpO2 100%  General:  Mildly agitated with shaking of his hands- does not respond to being called Eyes: PERRL,eyes closed,  normal lids, irises & conjunctiva ENT: difficult to assess as he will not open mouth Neck: no LAD, masses or thyromegaly Cardiovascular: RRR, no m/r/g. No LE edema. Respiratory: CTA bilaterally, no w/r/r. Normal respiratory effort. Abdomen: soft, non-tender, non- distended  Skin: no rash  Musculoskeletal: grossly normal tone BUE/BLE Psychiatric: non verbal Neurologic: unable to cooperate with exam, tremors of arms/ hands, eyes closed          Labs on Admission:  Basic Metabolic Panel:  Recent Labs Lab 12/29/13 1429  NA 139  K 4.3  CL 101  CO2 23  GLUCOSE 92  BUN 16  CREATININE 0.78  CALCIUM 8.9   Liver Function Tests: No results found for this basename: AST, ALT, ALKPHOS, BILITOT, PROT, ALBUMIN,  in the last 168 hours No results found for this basename: LIPASE, AMYLASE,  in the last 168 hours No results found for this basename: AMMONIA,  in the last 168 hours CBC:  Recent Labs Lab 12/29/13 1429  WBC 5.2  NEUTROABS 3.5  HGB 11.3*  HCT 33.8*  MCV 92.1  PLT 148*   Cardiac Enzymes: No results found for this basename: CKTOTAL, CKMB, CKMBINDEX, TROPONINI,  in the last 168 hours  BNP (last 3 results) No results  found for this basename: PROBNP,  in the last 8760 hours CBG: No results found for this basename: GLUCAP,  in the last 168 hours  Radiological Exams on Admission: Ct Head Wo Contrast  12/29/2013   CLINICAL DATA:  80 year old male status post fall yesterday but now with altered mental status. Initial encounter.  EXAM: CT HEAD WITHOUT CONTRAST  CT CERVICAL SPINE WITHOUT CONTRAST  TECHNIQUE: Multidetector CT imaging of the head and cervical spine was performed following the standard protocol without intravenous contrast. Multiplanar CT image reconstructions of the cervical spine were also generated.  COMPARISON:  Head CT without contrast 12/15/2013 and head and cervical spine CT 10/24/2013.  FINDINGS: CT HEAD FINDINGS  Less motion artifact today. No scalp hematoma identified. Negative orbits soft tissues. Visualized paranasal sinuses and mastoids are clear. No skull fracture identified. Calcified atherosclerosis at the skull base.  New low-density extra-axial collection on the right measuring up to 11 mm in thickness. Associated trace leftward midline shift. A tiny amount of hyperdense blood products are identified associated with this low-density collection (series 2, image 13). Furthermore, there is trace hemorrhage in the apex of the third ventricle (series 2, image 16), also new. No layering intraventricular hemorrhage. Mild effacement of the right lateral ventricle. No ventriculomegaly. Basilar cisterns are patent.  No definite parenchymal contusion. Stable gray-white matter differentiation throughout the brain. No evidence of cortically based acute infarction identified.  CT CERVICAL SPINE FINDINGS  Stable straightening of cervical lordosis. Visualized skull base is intact. No atlanto-occipital dissociation. Bilateral posterior element alignment is within normal limits. Chronic C4-C5 interbody arthrodesis. Multilevel degenerative endplate spurring. Cervicothoracic junction alignment is within normal limits.  No acute cervical spine fracture identified. Motion artifact in the visualized upper chest. Grossly intact visualized upper thoracic levels. Stable lung apices.  IMPRESSION: 1. New right side extra-axial collection measuring up to 11 mm in thickness favored to be a hyperacute subdural hematoma. Associated mild mass effect on the right lateral ventricle and trace leftward midline shift. 2. Trace intraventricular hemorrhage.  No ventriculomegaly. 3. No acute fracture or listhesis identified in the cervical spine. Ligamentous injury is not excluded. Study discussed by telephone with Dr. Mancel Bale on 12/29/2013 at 15:43 .   Electronically Signed   By: Augusto Gamble M.D.   On: 12/29/2013 15:45   Ct Cervical Spine Wo Contrast  12/29/2013   CLINICAL DATA:  80 year old male status post fall yesterday but now with altered mental status. Initial encounter.  EXAM: CT HEAD WITHOUT CONTRAST  CT CERVICAL SPINE WITHOUT CONTRAST  TECHNIQUE: Multidetector CT imaging of the head and cervical spine was performed following the standard protocol without intravenous contrast. Multiplanar CT image reconstructions of the cervical spine were also  generated.  COMPARISON:  Head CT without contrast 12/15/2013 and head and cervical spine CT 10/24/2013.  FINDINGS: CT HEAD FINDINGS  Less motion artifact today. No scalp hematoma identified. Negative orbits soft tissues. Visualized paranasal sinuses and mastoids are clear. No skull fracture identified. Calcified atherosclerosis at the skull base.  New low-density extra-axial collection on the right measuring up to 11 mm in thickness. Associated trace leftward midline shift. A tiny amount of hyperdense blood products are identified associated with this low-density collection (series 2, image 13). Furthermore, there is trace hemorrhage in the apex of the third ventricle (series 2, image 16), also new. No layering intraventricular hemorrhage. Mild effacement of the right lateral ventricle. No  ventriculomegaly. Basilar cisterns are patent.  No definite parenchymal contusion. Stable gray-white matter differentiation throughout the brain. No evidence of cortically based acute infarction identified.  CT CERVICAL SPINE FINDINGS  Stable straightening of cervical lordosis. Visualized skull base is intact. No atlanto-occipital dissociation. Bilateral posterior element alignment is within normal limits. Chronic C4-C5 interbody arthrodesis. Multilevel degenerative endplate spurring. Cervicothoracic junction alignment is within normal limits. No acute cervical spine fracture identified. Motion artifact in the visualized upper chest. Grossly intact visualized upper thoracic levels. Stable lung apices.  IMPRESSION: 1. New right side extra-axial collection measuring up to 11 mm in thickness favored to be a hyperacute subdural hematoma. Associated mild mass effect on the right lateral ventricle and trace leftward midline shift. 2. Trace intraventricular hemorrhage.  No ventriculomegaly. 3. No acute fracture or listhesis identified in the cervical spine. Ligamentous injury is not excluded. Study discussed by telephone with Dr. Mancel BaleELLIOTT WENTZ on 12/29/2013 at 15:43 .   Electronically Signed   By: Augusto GambleLee  Hall M.D.   On: 12/29/2013 15:45    EKG: Independently reviewed. SR at 82 bpm  Assessment/Plan Principal Problem:   Subdural hematoma - ER Dr spoke with Dr Gerlene FeeKritzer with NS- pt clearly not a surgical candidate - will be contacting hospice- his wife would like him to go to a hospice home - NPO for now as he is lethargic  Active Problems:   Dementia with Lewy bodies - has been on outpatient hospice,  total care at home and non-verbal - keep comfortable with PRN Ativan    Consults: Palliative care consulted  Code Status:DNR Family Communication: with his wife Disposition Plan: likely hospice home  Time spent: >45 min  Jaymin Waln [facility] Pager: [email or web address]

## 2013-12-29 NOTE — ED Notes (Addendum)
Pt arrives via EMS from home with fall yesterday from wheelchair hitting head on the floor, neg LOC. Per family pt with increased agitation last night. Family gave pt xanax. This afternoon home health RN noted swelling on right eye, sent in for evaluation due to not acting normally to baseline. Pt nonverbal, hx of advanced dementia, moving all extremities. CBG 105. VSS. 18g LAC.

## 2013-12-29 NOTE — Telephone Encounter (Signed)
Pt's Hospice Nurse Lexa called states that pt fell yesterday and got a big knot on his head, pt's wife call EMS but didn't send him to the ER. Pt's condition had changed this morning and they think he may have had a stroke. Lexa would like for Dr. Anne HahnWillis or nurse to call her back concerning this matter, the family is considering taking pt to the hospital. Lexa was wanting Dr. Anne HahnWillis to be aware of pt's condition and what is going on. Thanks

## 2013-12-30 ENCOUNTER — Encounter (HOSPITAL_COMMUNITY): Payer: Self-pay | Admitting: *Deleted

## 2013-12-30 DIAGNOSIS — R269 Unspecified abnormalities of gait and mobility: Secondary | ICD-10-CM

## 2013-12-30 LAB — URINE CULTURE
COLONY COUNT: NO GROWTH
Culture: NO GROWTH

## 2013-12-30 LAB — GLUCOSE, CAPILLARY: Glucose-Capillary: 69 mg/dL — ABNORMAL LOW (ref 70–99)

## 2013-12-30 NOTE — Progress Notes (Addendum)
Inpatient RN visit-[facility] 4N Room 31 [facility] RN Visit-Billy Merilynn FinlandRobertson RN  Related admission to [facility] diagnosis of Lewy Body dementia. Pt is DNR code.   Pt lying in bed,eyes closed, facial muscles relaxed.  Responds to touch/voice by moving arms. Pt is NPO due to lethargy. Condom catheter placed this am r/t to patient agitation with personal care. Wife and daughter at bedside shared they discussed patient's condition and decline with attending MD. Patient's wife did ask about the benefits of IVF, discussed benefits vs burdens, Hard Choices booklet given and questions answered. Family voiced understanding that at this point in patient's decline IVF would not be beneficial. Family also voiced understanding that when patient is alert he can have ice chips with head of bed up.  Family does not wish to persue any further interventions related to subdural hematoma and have requested that patient be [facility]. [facility] SW [place] and unit CSW  notified of family wishes  Patient's home medication list and transfer summary on shadow chart.   Please call [facility] @ [phone]-with any hospice needs.   Thank you. Billy StarlingKaren E. [name], RN  [facility]  Hospice Liaison  [phone])

## 2013-12-30 NOTE — Progress Notes (Signed)
   CARE MANAGEMENT NOTE 12/30/2013  Patient:  Ruta HindsHINNANT,Josha V   Account Number:  [id number]  Date Initiated:  12/30/2013  Documentation initiated by:  Jiles CrockerHANDLER,Kolina Kube  Subjective/Objective Assessment:   ADMITTED POST FALL/ SDH     Action/Plan:   PATIENT IS FOLLOWED BY [facility]   Anticipated DC Date:  01/02/2014   [facility]  In-house referral  Clinical Social Worker      DC Planning Services  CM consult          Status of service:  In process, will continue to follow Medicare Important Message given?  NA - LOS <3 / Initial given by admissions (If response is "NO", the following Medicare IM given date fields will be blank)  Per UR Regulation:  Reviewed for med. necessity/level of care/duration of stay  Comments:  4/10/2015[facility] RN,BSN,MHA [phone]

## 2013-12-30 NOTE — Clinical Social Work Note (Signed)
CS[facility]'s [facility] disposition. Per [facility] CSW, pt [facility]. CSW has left handoff for weekend CSW regarding discharge plans. Weekend CSW to follow and assist with discharge.  Darlyn ChamberEmily [place], LCSWA Clinical Social Worker [phone]

## 2013-12-30 NOTE — Progress Notes (Signed)
[facility]                                                                              Patient Demographics  [name], is a 80 y.o. male, DOB - [birth year], RUE:[id number]  Admit date - 12/29/2013   Admitting Physician Calvert CantorSaima Rizwan, MD  Outpatient Primary MD for the patient is Burtis JunesBLOUNT,ALVIN VINCENT, MD  LOS - 1   Chief Complaint  Patient presents with  . Fall      HPI [name] is a 80 y.o. male with PMH of lewy body dementia, end stage on hospice at home who fell from his wheelchair and hit his head on the floor. He developed a lump over the right eye, was quite agitated all night despite oral Ativan and then lethargic around 11 PM. He is found to have a subdural hematoma- right sided 11 mm with leftward shift and mild mass effect on right lateral ventricle. He is total care and non-verbal at home and has an aid during the day. His wife takes care of him at night.    Assessment & Plan   Subdural hematoma secondary to fall -CT of the head:New right side extra-axial collection measuring up to 11 mm in thickness favored to be a hyperacute subdural hematoma -ER attending spoke with Dr. Gerlene FeeKritzer, neurosurgery, patient is not a surgical candidate -Patient is currently hospice at home -Currently patient is lethargic to continue n.p.o. Status -[facility]  Lewy body dementia -End-stage currently on home hospice -Patient is nonverbal -Continue when necessary Ativan and supportive measures  Code Status: DNR   Family Communication: Daughter and wife at bedside  Disposition Plan: Admitted  Time Spent in minutes   30 minutes  Procedures none  Consults   Hospice/ palliative care  DVT Prophylaxis  none  Lab Results  Component Value Date   PLT 148* 12/29/2013    Medications  Scheduled Meds: . sodium chloride  3 mL Intravenous Q12H   Continuous Infusions:  PRN Meds:.sodium chloride, LORazepam, sodium  chloride  Antibiotics   Anti-infectives   None      Subjective:   [name] seen and examined today.  Patient currently nonverbal.  Objective:   Filed Vitals:   12/29/13 1815 12/29/13 1845 12/29/13 2218 12/30/13 0534  BP: 133/68 152/108 96/60 109/82  Pulse: 82 85 75 80  Temp:   98.7 F (37.1 C) 97.5 F (36.4 C)  TempSrc:   Axillary Oral  Resp: 26 20 20    SpO2: 100% 100% 99% 98%    Wt Readings from Last 3 Encounters:  09/06/13 63.05 kg (139 lb)  07/11/13 66.588 kg (146 lb 12.8 oz)  02/22/13 70.308 kg (155 lb)    No intake or output data in the 24 hours ending 12/30/13 0848  Exam  General: Well developed, mal-nourished, NAD, appears stated age  HEENT: NCAT, PERRLA, EOMI, Anicteic Sclera, mucous membranes moist.  Neck: Supple, no JVD, no masses  Cardiovascular: S1 S2 auscultated, no rubs, murmurs or gallops. Regular rate and rhythm.  Respiratory: Clear to auscultation bilaterally with equal chest rise  Abdomen: Soft, nontender, nondistended, + bowel sounds  Extremities: warm dry  without cyanosis clubbing or edema  Neuro: Unable to assess at this time due to patient's current state  Skin: Without rashes exudates or nodules  Psych:Unable to assess at this time due to patient's current state   Data Review   Micro Results No results found for this or any previous visit (from the past 240 hour(s)).  Radiology Reports Dg Chest 1 View  12/15/2013   CLINICAL DATA:  Altered mental status.  EXAM: CHEST - 1 VIEW  COMPARISON:  Chest x-ray 03/05/2009.  FINDINGS: Lung volumes are low. No consolidative airspace disease. No pleural effusions. No pneumothorax. No pulmonary nodule or mass noted. Pulmonary vasculature and the cardiomediastinal silhouette are within normal limits. Ectasia and atherosclerosis of the thoracic aorta (thoracic aortic arch estimated to measure approximately 4 cm in diameter).  IMPRESSION: 1. Low lung volumes without radiographic evidence of  acute cardiopulmonary disease. 2. Ectasia and atherosclerosis of the thoracic aorta, similar prior studies.   Electronically Signed   By: Trudie Reed M.D.   On: 12/15/2013 21:02   Ct Head Wo Contrast  12/29/2013   CLINICAL DATA:  80 year old male status post fall yesterday but now with altered mental status. Initial encounter.  EXAM: CT HEAD WITHOUT CONTRAST  CT CERVICAL SPINE WITHOUT CONTRAST  TECHNIQUE: Multidetector CT imaging of the head and cervical spine was performed following the standard protocol without intravenous contrast. Multiplanar CT image reconstructions of the cervical spine were also generated.  COMPARISON:  Head CT without contrast 12/15/2013 and head and cervical spine CT 10/24/2013.  FINDINGS: CT HEAD FINDINGS  Less motion artifact today. No scalp hematoma identified. Negative orbits soft tissues. Visualized paranasal sinuses and mastoids are clear. No skull fracture identified. Calcified atherosclerosis at the skull base.  New low-density extra-axial collection on the right measuring up to 11 mm in thickness. Associated trace leftward midline shift. A tiny amount of hyperdense blood products are identified associated with this low-density collection (series 2, image 13). Furthermore, there is trace hemorrhage in the apex of the third ventricle (series 2, image 16), also new. No layering intraventricular hemorrhage. Mild effacement of the right lateral ventricle. No ventriculomegaly. Basilar cisterns are patent.  No definite parenchymal contusion. Stable gray-white matter differentiation throughout the brain. No evidence of cortically based acute infarction identified.  CT CERVICAL SPINE FINDINGS  Stable straightening of cervical lordosis. Visualized skull base is intact. No atlanto-occipital dissociation. Bilateral posterior element alignment is within normal limits. Chronic C4-C5 interbody arthrodesis. Multilevel degenerative endplate spurring. Cervicothoracic junction alignment is  within normal limits. No acute cervical spine fracture identified. Motion artifact in the visualized upper chest. Grossly intact visualized upper thoracic levels. Stable lung apices.  IMPRESSION: 1. New right side extra-axial collection measuring up to 11 mm in thickness favored to be a hyperacute subdural hematoma. Associated mild mass effect on the right lateral ventricle and trace leftward midline shift. 2. Trace intraventricular hemorrhage.  No ventriculomegaly. 3. No acute fracture or listhesis identified in the cervical spine. Ligamentous injury is not excluded. Study discussed by telephone with Dr. Mancel Bale on 12/29/2013 at 15:43 .   Electronically Signed   By: Augusto Gamble M.D.   On: 12/29/2013 15:45   Ct Head Wo Contrast  12/15/2013   CLINICAL DATA:  Extremity shaking.  EXAM: CT HEAD WITHOUT CONTRAST  TECHNIQUE: Contiguous axial images were obtained from the base of the skull through the vertex without intravenous contrast.  COMPARISON:  CT HEAD W/O CM dated 10/24/2013; MR HEAD W/O CM dated 11/20/2009  FINDINGS: Images are mildly to moderately degraded by motion. Moderate cerebral atrophy is unchanged. Periventricular white matter hypodensities do not appear significantly changed and are compatible with moderate chronic small vessel ischemic disease. There is no evidence of acute cortical infarct, mass, midline shift, intracranial hemorrhage, or extra-axial fluid collection.  Prior bilateral cataract surgery is noted. The visualized paranasal sinuses and mastoid air cells are clear.  IMPRESSION: No evidence of acute intracranial abnormality.   Electronically Signed   By: Sebastian Ache   On: 12/15/2013 21:47   Ct Cervical Spine Wo Contrast  12/29/2013   CLINICAL DATA:  80 year old male status post fall yesterday but now with altered mental status. Initial encounter.  EXAM: CT HEAD WITHOUT CONTRAST  CT CERVICAL SPINE WITHOUT CONTRAST  TECHNIQUE: Multidetector CT imaging of the head and cervical spine was  performed following the standard protocol without intravenous contrast. Multiplanar CT image reconstructions of the cervical spine were also generated.  COMPARISON:  Head CT without contrast 12/15/2013 and head and cervical spine CT 10/24/2013.  FINDINGS: CT HEAD FINDINGS  Less motion artifact today. No scalp hematoma identified. Negative orbits soft tissues. Visualized paranasal sinuses and mastoids are clear. No skull fracture identified. Calcified atherosclerosis at the skull base.  New low-density extra-axial collection on the right measuring up to 11 mm in thickness. Associated trace leftward midline shift. A tiny amount of hyperdense blood products are identified associated with this low-density collection (series 2, image 13). Furthermore, there is trace hemorrhage in the apex of the third ventricle (series 2, image 16), also new. No layering intraventricular hemorrhage. Mild effacement of the right lateral ventricle. No ventriculomegaly. Basilar cisterns are patent.  No definite parenchymal contusion. Stable gray-white matter differentiation throughout the brain. No evidence of cortically based acute infarction identified.  CT CERVICAL SPINE FINDINGS  Stable straightening of cervical lordosis. Visualized skull base is intact. No atlanto-occipital dissociation. Bilateral posterior element alignment is within normal limits. Chronic C4-C5 interbody arthrodesis. Multilevel degenerative endplate spurring. Cervicothoracic junction alignment is within normal limits. No acute cervical spine fracture identified. Motion artifact in the visualized upper chest. Grossly intact visualized upper thoracic levels. Stable lung apices.  IMPRESSION: 1. New right side extra-axial collection measuring up to 11 mm in thickness favored to be a hyperacute subdural hematoma. Associated mild mass effect on the right lateral ventricle and trace leftward midline shift. 2. Trace intraventricular hemorrhage.  No ventriculomegaly. 3. No  acute fracture or listhesis identified in the cervical spine. Ligamentous injury is not excluded. Study discussed by telephone with Dr. Mancel Bale on 12/29/2013 at 15:43 .   Electronically Signed   By: Augusto Gamble M.D.   On: 12/29/2013 15:45    CBC  Recent Labs Lab 12/29/13 1429  WBC 5.2  HGB 11.3*  HCT 33.8*  PLT 148*  MCV 92.1  MCH 30.8  MCHC 33.4  RDW 13.6  LYMPHSABS 0.9  MONOABS 0.8  EOSABS 0.0  BASOSABS 0.0    Chemistries   Recent Labs Lab 12/29/13 1429  NA 139  K 4.3  CL 101  CO2 23  GLUCOSE 92  BUN 16  CREATININE 0.78  CALCIUM 8.9   ------------------------------------------------------------------------------------------------------------------ CrCl is unknown because both a height and weight (above a minimum accepted value) are required for this calculation. ------------------------------------------------------------------------------------------------------------------ No results found for this basename: HGBA1C,  in the last 72 hours ------------------------------------------------------------------------------------------------------------------ No results found for this basename: CHOL, HDL, LDLCALC, TRIG, CHOLHDL, LDLDIRECT,  in the last 72 hours ------------------------------------------------------------------------------------------------------------------ No results found for this  basename: TSH, T4TOTAL, FREET3, T3FREE, THYROIDAB,  in the last 72 hours ------------------------------------------------------------------------------------------------------------------ No results found for this basename: VITAMINB12, FOLATE, FERRITIN, TIBC, IRON, RETICCTPCT,  in the last 72 hours  Coagulation profile No results found for this basename: INR, PROTIME,  in the last 168 hours  No results found for this basename: DDIMER,  in the last 72 hours  Cardiac Enzymes No results found for this basename: CK, CKMB, TROPONINI, MYOGLOBIN,  in the last 168  hours ------------------------------------------------------------------------------------------------------------------ No components found with this basename: POCBNP,     Vikram Tillett D.O. on 12/30/2013 at 8:48 AM  Between 7am to 7pm - Pager - [phone]  After 7pm go to [email or web address] - password [email or web address]  And look for the night coverage person covering for me after hours  [facility] Office  [phone]

## 2013-12-30 NOTE — Progress Notes (Signed)
SLP Cancellation Note  Patient Details Name: Ruta HindsRudolph V Brodowski MRN: [id number] DOB: 1923/02/19   Cancelled evaluation:    Pt not sufficiently alert to participate in swallow assessment.  Spoke with wife at bedside.  Will return next date.     Marchelle Folksmanda Laurice Jahziel Sinn 12/30/2013, 6:02 PM

## 2013-12-30 NOTE — Progress Notes (Signed)
RM 4N31C      [name]                                         [facility] MSW Note:  SW spoke with daughter regarding pt's current status and disposition. Discussed BP vs home. Explained cost and criteria. Informed that a bed is available tomorrow. Also explained that once at BP, should pt's status happen to improve, family will have the option of having him return home. Dtr intends to contact her brother who is out of town to discuss cost. She also intends to discuss the same with her mother who was not present for the above discussion. She will contact SW before the end of the day to inform of family's decision. Explain that should family prefer pt to return home, Hospice will continue to follow and support all involved. Althia Fortsrucia [place], LCSW

## 2013-12-30 NOTE — Progress Notes (Signed)
[facility])  Hospice HomeCare Chaplain Note   Patient (pt[name]   Hospice Homecare Chaplain visited to assess for spiritual needs.  Pcg, her dtr, as well as a cousin and his wife were at bedside.  Pcg indicated [facility].  She voiced desire to go and see [facility] this afternoon and chaplain encouraged her to do so.  Chaplain described facility and welcomed pcg's expression of thoughts/questions.  Chaplain utilized active listening, normalizing feelings and affirming faith.  Chaplain provided pastoral presence and prayer with blessing.  Chaplain will continue to provide ongoing support and will follow.  Beverly Isley-Landreth ThM, BCCC [facility] Clinical Chaplain

## 2013-12-30 NOTE — Progress Notes (Signed)
Nutrition Brief Note  Patient identified due to Low Braden Score  Wt Readings from Last 15 Encounters:  09/06/13 139 lb (63.05 kg)  07/11/13 146 lb 12.8 oz (66.588 kg)  02/22/13 155 lb (70.308 kg)  09/07/12 150 lb (68.04 kg)   Reviewed pt's chart. Pt is a hospice care pt. Pt is NPO due to lethargy. Pt is allowed ice chips when alert. No IVF. Labs and medications reviewed. Pt has requested [facility] be [facility].  No nutrition interventions warranted at this time. If nutrition issues arise, please consult RD.   Ian Malkineanne Barnett RD, LDN Inpatient Clinical Dietitian Pager: [phone] After Hours Pager: [phone]

## 2013-12-31 DIAGNOSIS — I359 Nonrheumatic aortic valve disorder, unspecified: Secondary | ICD-10-CM

## 2013-12-31 DIAGNOSIS — R413 Other amnesia: Secondary | ICD-10-CM

## 2013-12-31 NOTE — Progress Notes (Signed)
[facility]ing [facility]; ready for transfer today.

## 2013-12-31 NOTE — Progress Notes (Signed)
[facility] is [facility]. Per Darrol PokeEva [facility] liaison patient can come today. Clinical Child psychotherapistocial Worker (CSW) [facility] and prepared D/C packet. CSW arranged EMS ([facility]) for transport. Patient's family is at bedside and they are aware of above. Nursing is aware of above. Please reconsult if further social work needs arise. CSW signing off.   Jetta LoutBailey Morgan, LCSWA Weekend CSW [phone]

## 2013-12-31 NOTE — Evaluation (Signed)
Clinical/Bedside Swallow Evaluation Patient Details  Name: [name] MRN: [id number] Date of Birth: 1923/04/15  Today's Date: 12/31/2013 Time:  -     Past Medical History:  Past Medical History  Diagnosis Date  . Stroke   . Diabetes   . High cholesterol   . HBP (high blood pressure)   . Blurred vision   . Lewy body dementia   . Restless leg syndrome   . Diplopia   . Polyneuropathy in diabetes(357.2)   . Hallucinations   . Mild aortic stenosis   . Abnormality of gait 10/31/2013   Past Surgical History:  Past Surgical History  Procedure Laterality Date  . Hemorrhoid surgery  1950's  . Prostate surgery  2004  . Cataract extraction Bilateral 2010  . Whipple procedure    . Inguinal hernia repair Right    HPI:  80 y.o. male with PMH of lewy body dementia, end stage on hospice at home who fell from his wheelchair and hit his head on the floor. He developed a lump over the right eye, was quite agitated all night despite oral Ativan and then lethargic around 11 PM. He is found to have a subdural hematoma- right sided 11 mm with leftward shift and mild mass effect on right lateral ventricle. He is total care and non-verbal at home and has an aid during the day. His wife takes care of him at night.  Per family, pt eats well at home and has no hx of swallowing difficulty.   Assessment / Plan / Recommendation Clinical Impression  Pt presents with remarkably functional swallow given advanced dementia.  Demonstrates adequate bolus recognition and preparation, intermittent oral holding of POs, presence of swallow response, and no overt s/s of aspiration.  Family reports his behavior (eyes closed but alert, occasional talking) and method of eating is close to baseline.  [facility].  Recommend dysphagia 2 diet for ease with eating and thin liquids.  No SLP f/u warranted.     Aspiration Risk  Mild    Diet Recommendation Dysphagia 2 (Fine chop);Thin liquid    Liquid Administration via: Cup;Straw Medication Administration: Crushed with puree Supervision: Full supervision/cueing for compensatory strategies;Trained caregiver to feed patient Postural Changes and/or Swallow Maneuvers: Seated upright 90 degrees    Other  Recommendations Oral Care Recommendations: Oral care BID   Follow Up Recommendations  None    SLP Swallow Goals     Swallow Study Prior Functional Status       General Date of Onset: 12/29/13 HPI: 80 y.o. male with PMH of lewy body dementia, end stage on hospice at home who fell from his wheelchair and hit his head on the floor. He developed a lump over the right eye, was quite agitated all night despite oral Ativan and then lethargic around 11 PM. He is found to have a subdural hematoma- right sided 11 mm with leftward shift and mild mass effect on right lateral ventricle. He is total care and non-verbal at home and has an aid during the day. His wife takes care of him at night.  Per family, pt eats well at home and has no hx of swallowing difficulty. Type of Study: Bedside swallow evaluation Previous Swallow Assessment: none per records Diet Prior to this Study: NPO Temperature Spikes Noted: No Respiratory Status: Room air Behavior/Cognition: Alert;Confused;Requires cueing Oral Cavity - Dentition: Adequate natural dentition Self-Feeding Abilities: Total assist Patient Positioning: Upright in bed Baseline Vocal Quality: Clear Volitional Cough: Cognitively  unable to elicit Volitional Swallow: Unable to elicit    Oral/Motor/Sensory Function Overall Oral Motor/Sensory Function: Appears within functional limits for tasks assessed   Ice Chips Ice chips: Within functional limits Presentation: Spoon   Thin Liquid Thin Liquid: Within functional limits Presentation: Cup;Straw    Nectar Thick Nectar Thick Liquid: Not tested   Honey Thick Honey Thick Liquid: Not tested   Puree Puree: Impaired Presentation: Spoon Oral Phase  Functional Implications: Oral holding   Solid   Jackolyn Geron L. [place], [place] CCC/SLP Pager [phone]     Solid: Not tested       Carolan Shiver 12/31/2013,11:22 AM

## 2013-12-31 NOTE — Discharge Instructions (Signed)
Hospice Care  °Hospice is a care service which can be used by people who are terminally ill and in whom healing is no longer thought possible. Hospice care is for people believed to have no more than 6 months to live. It is meant to help with the two largest fears near the end of life (the fears of dying and of being alone), as well as pain management, and an attempt to allow people to pass away comfortably at home.  °Hospice staff: °· Administer appropriate pain relief. °· Provide nursing care. °· Offer reassurance and support to loved ones and family members. °· Provide services to keep people comfortable at home or in a hospice facility. °Together, you can see to it that your loved one is not alone during this last and important phase of life. °You, your family, and your caregivers help you decide when hospice services should begin. If your condition improves or the disease goes into remission, you can be discharged from the hospice program. You can return to hospice care at a later time if needed. °The hospice philosophy recognizes death as the final stage of life. It helps patients continue an alert, pain-free life, and manage symptoms while surrounded by their loved ones. Hospice affirms life without hurrying death. Hospice care treats the person rather than the disease. It emphasizes quality of life with family-centered care. Hospice care involves the patient and family and helps them make decisions.  °The care is designed to: °· Relieve or decrease pain. °· Control other problems. °· Provide as much quality time as possible. °· Allow people to die with dignity. °Unlike other medical care, the focus is no longer on curing disease. The goal of hospice care is to offer as high a quality of life as possible during the end of life. In this way, the last days of life may be spent with dignity.  °With hospice care, instead of spending the last weeks or months in a hospital, a person is with loved ones in the home  or a homelike setting. About 90 percent of hospice care is provided at home. But hospice is available wherever a person lives, including a nursing home or assisted-living residence. Some residential hospices designed specifically for hospice care also exist. Hospice care is available for many types of terminal illnesses. °Hospice services are meant to serve both the patient and family members. °· Comfort. In most cases, the individual stays in his or her home or in homelike surroundings instead of in a hospital. The core of hospice is a cooperative effort by family, friends and a team of professional and volunteer caregivers working together to meet your loved one's needs. This team supplies all necessary medicines and equipment. It works with both the person involved and family members to relieve pain and symptoms. °· Support. Individuals enjoy the support of loved ones by receiving much of the basic care from family and friends. A nurse may lead the team and coordinates the day-to-day care. A doctor is also part of the team. Chaplains and social workers are available to counsel the family and their loved one. They make sure emotional, spiritual, and social needs are being met. Trained volunteers perform a wide variety of tasks as needed, such as: °· Providing companionship. °· Doing light housekeeping. °· Preparing meals. °· Running errands. °· Providing respite for the family. °· Improving quality of life. Caring for someone who is dying is emotionally and physically demanding. This can be particularly true for family members   who are primary caregivers. But you can take comfort in knowing that hospice is an act of love that can improve the quality of life for all involved. Professionals are often available to tend to the needs of grieving family members as well.  Spiritual Care. Hospice care emphasizes the spiritual needs of you and your family. People differ in their spiritual needs and religious beliefs so  spiritual care is individualized to meet the persons' and family's needs. It may include helping you to look at what death means to you, to say good-bye, or to perform a specific religious ceremony or ritual. HOW TO SELECT A PROGRAM Most hospice programs are run by nonprofit, independent organizations. Some are affiliated with hospitals, nursing homes or home health care agencies. Some are for-profit organizations. You can learn about existing hospice programs in your area from your health caregivers. ASK THE FOLLOWING:  What services are available to the patient?  What services are offered to the family?  Are bereavement services available?  How involved are the family members?  How involved is the doctor?  Who makes up the hospice care team? How are they trained or screened?  How will the individual's pain and symptoms be managed?  If circumstances change, can services be provided in different settings, such as the home or the hospital?  Is the program reviewed and licensed by the state or certified in some other way?  Are all costs covered by insurance? How much you pay for hospice care can vary greatly. It depends on the length and type of care necessary and your insurance coverage. Medicare and most private insurance plans, including managed care organizations, cover hospice care. Hospice is also covered by Medicaid in most states. Some hospice programs provide services on a sliding fee scale, based on your ability to pay. They may also provide some durable medical equipment for support within the home. Document Released: 12/26/2003 Document Revised: 12/01/2011 Document Reviewed: 09/08/2005 [facility] Patient Information 2014 [place], [place].

## 2013-12-31 NOTE — Discharge Summary (Addendum)
Physician Discharge Summary  [name] ZOX:[id number] DOB: [birth year] DOA: 12/29/2013  PCP: Burtis Junes, MD  Admit date: 12/29/2013 Discharge date: 12/31/2013  Time spent: 45 minutes  Recommendations for Outpatient Follow-up:  Patient will be discharged to [facility] place[facility]hospice. Continue medications as prescribed.  Discharge Diagnoses:  Principal Problem:   Subdural hematoma Active Problems:   Dementia with Lewy bodies  Discharge Condition: stable  Diet recommendation: Dysphagia 2 (family was counseled on possible aspiration.)  Filed Weights   12/30/13 1600  Weight: 57.97 kg (127 lb 12.8 oz)    History of present illness:  [name] is a 80 y.o. male with PMH of lewy body dementia, end stage on hospice at home who fell from his wheelchair and hit his head on the floor. He developed a lump over the right eye, was quite agitated all night despite oral Ativan and then lethargic around 11 PM. He is found to have a subdural hematoma- right sided 11 mm with leftward shift and mild mass effect on right lateral ventricle. He is total care and non-verbal at home and has an aid during the day. His wife takes care of him at night.   Hospital Course:  Subdural hematoma secondary to fall  -CT of the head:New right side extra-axial collection measuring up to 11 mm in thickness favored to be a hyperacute subdural hematoma  -ER attending spoke with Dr. Gerlene Fee, neurosurgery, patient is not a surgical candidate  -Patient is currently hospice at home  -Currently patient is lethargic  -Patient will be discharged to [facility] place on 12/31/2013  -Speech recommend dysphagia 2 diet.  Family counseled on possible aspiration.  Lewy body dementia  -End-stage currently on home hospice  -Patient is nonverbal  -Continue when necessary Ativan and supportive measures  Procedures: None  Consultations: Palliative care/hospice  Discharge Exam: Filed Vitals:   12/31/13 0921  BP:  113/69  Pulse: 79  Temp: 97.5 F (36.4 C)  Resp: 20   Exam  General: Well developed, mal-nourished, NAD, appears stated age  HEENT: NCAT, PERRLA, EOMI, Anicteic Sclera, mucous membranes moist.  Neck: Supple, no JVD, no masses  Cardiovascular: S1 S2 auscultated, no rubs, murmurs or gallops. Regular rate and rhythm.  Respiratory: Clear to auscultation bilaterally with equal chest rise  Abdomen: Soft, nontender, nondistended, + bowel sounds  Extremities: warm dry without cyanosis clubbing or edema  Neuro: Unable to assess at this time due to patient's current state  Skin: Without rashes exudates or nodules  Psych:Unable to assess at this time due to patient's current state  Discharge Instructions      Discharge Orders   Future Appointments Provider Department Dept Phone   03/07/2014 10:30 AM Nilda Riggs, NP [facility] [phone]   Future Orders Complete By Expires   Discharge instructions  As directed        Medication List    STOP taking these medications       escitalopram 5 MG tablet  Commonly known as:  LEXAPRO     memantine 10 MG tablet  Commonly known as:  NAMENDA     nitroGLYCERIN 0.4 MG SL tablet  Commonly known as:  NITROSTAT     SLEEP AID PO      TAKE these medications       LORazepam 0.5 MG tablet  Commonly known as:  ATIVAN  Take 0.5 mg by mouth every 6 (six) hours as needed (restlessness.).       Allergies  Allergen Reactions  .  Zetia [Ezetimibe]     cramps   Follow-up Information   Follow up with Burtis Junes, MD. (As needed)    Specialty:  Family Medicine   Contact information:   [address] [address] [place] [address] [phone]       Follow up with Physician at [facility] place In 2 days.       The results of significant diagnostics from this hospitalization (including imaging, microbiology, ancillary and laboratory) are listed below for reference.    Significant Diagnostic  Studies: Dg Chest 1 View  12/15/2013   CLINICAL DATA:  Altered mental status.  EXAM: CHEST - 1 VIEW  COMPARISON:  Chest x-ray 03/05/2009.  FINDINGS: Lung volumes are low. No consolidative airspace disease. No pleural effusions. No pneumothorax. No pulmonary nodule or mass noted. Pulmonary vasculature and the cardiomediastinal silhouette are within normal limits. Ectasia and atherosclerosis of the thoracic aorta (thoracic aortic arch estimated to measure approximately 4 cm in diameter).  IMPRESSION: 1. Low lung volumes without radiographic evidence of acute cardiopulmonary disease. 2. Ectasia and atherosclerosis of the thoracic aorta, similar prior studies.   Electronically Signed   By: Trudie Reed M.D.   On: 12/15/2013 21:02   Ct Head Wo Contrast  12/29/2013   CLINICAL DATA:  80 year old male status post fall yesterday but now with altered mental status. Initial encounter.  EXAM: CT HEAD WITHOUT CONTRAST  CT CERVICAL SPINE WITHOUT CONTRAST  TECHNIQUE: Multidetector CT imaging of the head and cervical spine was performed following the standard protocol without intravenous contrast. Multiplanar CT image reconstructions of the cervical spine were also generated.  COMPARISON:  Head CT without contrast 12/15/2013 and head and cervical spine CT 10/24/2013.  FINDINGS: CT HEAD FINDINGS  Less motion artifact today. No scalp hematoma identified. Negative orbits soft tissues. Visualized paranasal sinuses and mastoids are clear. No skull fracture identified. Calcified atherosclerosis at the skull base.  New low-density extra-axial collection on the right measuring up to 11 mm in thickness. Associated trace leftward midline shift. A tiny amount of hyperdense blood products are identified associated with this low-density collection (series 2, image 13). Furthermore, there is trace hemorrhage in the apex of the third ventricle (series 2, image 16), also new. No layering intraventricular hemorrhage. Mild effacement of the  right lateral ventricle. No ventriculomegaly. Basilar cisterns are patent.  No definite parenchymal contusion. Stable gray-white matter differentiation throughout the brain. No evidence of cortically based acute infarction identified.  CT CERVICAL SPINE FINDINGS  Stable straightening of cervical lordosis. Visualized skull base is intact. No atlanto-occipital dissociation. Bilateral posterior element alignment is within normal limits. Chronic C4-C5 interbody arthrodesis. Multilevel degenerative endplate spurring. Cervicothoracic junction alignment is within normal limits. No acute cervical spine fracture identified. Motion artifact in the visualized upper chest. Grossly intact visualized upper thoracic levels. Stable lung apices.  IMPRESSION: 1. New right side extra-axial collection measuring up to 11 mm in thickness favored to be a hyperacute subdural hematoma. Associated mild mass effect on the right lateral ventricle and trace leftward midline shift. 2. Trace intraventricular hemorrhage.  No ventriculomegaly. 3. No acute fracture or listhesis identified in the cervical spine. Ligamentous injury is not excluded. Study discussed by telephone with Dr. Mancel Bale on 12/29/2013 at 15:43 .   Electronically Signed   By: Augusto Gamble M.D.   On: 12/29/2013 15:45   Ct Head Wo Contrast  12/15/2013   CLINICAL DATA:  Extremity shaking.  EXAM: CT HEAD WITHOUT CONTRAST  TECHNIQUE: Contiguous axial images  were obtained from the base of the skull through the vertex without intravenous contrast.  COMPARISON:  CT HEAD W/O CM dated 10/24/2013; MR HEAD W/O CM dated 11/20/2009  FINDINGS: Images are mildly to moderately degraded by motion. Moderate cerebral atrophy is unchanged. Periventricular white matter hypodensities do not appear significantly changed and are compatible with moderate chronic small vessel ischemic disease. There is no evidence of acute cortical infarct, mass, midline shift, intracranial hemorrhage, or extra-axial fluid  collection.  Prior bilateral cataract surgery is noted. The visualized paranasal sinuses and mastoid air cells are clear.  IMPRESSION: No evidence of acute intracranial abnormality.   Electronically Signed   By: Sebastian AcheAllen  Grady   On: 12/15/2013 21:47   Ct Cervical Spine Wo Contrast  12/29/2013   CLINICAL DATA:  80 year old male status post fall yesterday but now with altered mental status. Initial encounter.  EXAM: CT HEAD WITHOUT CONTRAST  CT CERVICAL SPINE WITHOUT CONTRAST  TECHNIQUE: Multidetector CT imaging of the head and cervical spine was performed following the standard protocol without intravenous contrast. Multiplanar CT image reconstructions of the cervical spine were also generated.  COMPARISON:  Head CT without contrast 12/15/2013 and head and cervical spine CT 10/24/2013.  FINDINGS: CT HEAD FINDINGS  Less motion artifact today. No scalp hematoma identified. Negative orbits soft tissues. Visualized paranasal sinuses and mastoids are clear. No skull fracture identified. Calcified atherosclerosis at the skull base.  New low-density extra-axial collection on the right measuring up to 11 mm in thickness. Associated trace leftward midline shift. A tiny amount of hyperdense blood products are identified associated with this low-density collection (series 2, image 13). Furthermore, there is trace hemorrhage in the apex of the third ventricle (series 2, image 16), also new. No layering intraventricular hemorrhage. Mild effacement of the right lateral ventricle. No ventriculomegaly. Basilar cisterns are patent.  No definite parenchymal contusion. Stable gray-white matter differentiation throughout the brain. No evidence of cortically based acute infarction identified.  CT CERVICAL SPINE FINDINGS  Stable straightening of cervical lordosis. Visualized skull base is intact. No atlanto-occipital dissociation. Bilateral posterior element alignment is within normal limits. Chronic C4-C5 interbody arthrodesis.  Multilevel degenerative endplate spurring. Cervicothoracic junction alignment is within normal limits. No acute cervical spine fracture identified. Motion artifact in the visualized upper chest. Grossly intact visualized upper thoracic levels. Stable lung apices.  IMPRESSION: 1. New right side extra-axial collection measuring up to 11 mm in thickness favored to be a hyperacute subdural hematoma. Associated mild mass effect on the right lateral ventricle and trace leftward midline shift. 2. Trace intraventricular hemorrhage.  No ventriculomegaly. 3. No acute fracture or listhesis identified in the cervical spine. Ligamentous injury is not excluded. Study discussed by telephone with Dr. Mancel BaleELLIOTT WENTZ on 12/29/2013 at 15:43 .   Electronically Signed   By: Augusto GambleLee  Hall M.D.   On: 12/29/2013 15:45    Microbiology: Recent Results (from the past 240 hour(s))  URINE CULTURE     Status: None   Collection Time    12/29/13  2:43 PM      Result Value Ref Range Status   Specimen Description URINE, CATHETERIZED   Final   Special Requests NONE   Final   Culture  Setup Time     Final   Value: 12/29/2013 15:20     Performed at [facility]   Colony Count     Final   Value: NO GROWTH     Performed at [facility]   Culture  Final   Value: NO GROWTH     Performed at [facility]   Report Status 12/30/2013 FINAL   Final     Labs: Basic Metabolic Panel:  Recent Labs Lab 12/29/13 1429  NA 139  K 4.3  CL 101  CO2 23  GLUCOSE 92  BUN 16  CREATININE 0.78  CALCIUM 8.9   Liver Function Tests: No results found for this basename: AST, ALT, ALKPHOS, BILITOT, PROT, ALBUMIN,  in the last 168 hours No results found for this basename: LIPASE, AMYLASE,  in the last 168 hours No results found for this basename: AMMONIA,  in the last 168 hours CBC:  Recent Labs Lab 12/29/13 1429  WBC 5.2  NEUTROABS 3.5  HGB 11.3*  HCT 33.8*  MCV 92.1  PLT 148*   Cardiac Enzymes: No results  found for this basename: CKTOTAL, CKMB, CKMBINDEX, TROPONINI,  in the last 168 hours BNP: BNP (last 3 results) No results found for this basename: PROBNP,  in the last 8760 hours CBG:  Recent Labs Lab 12/30/13 1142  GLUCAP 69*    Signed:  Kynzli Rease  [facility] 12/31/2013, 11:16 AM

## 2013-12-31 NOTE — Progress Notes (Signed)
Palliative Medicine consult received. [facility]. If there are symptom management needs or changes in goals of care please call [phone] to request full consultation- otherwise care per [facility].  Anderson MaltaElizabeth Avamarie Crossley, DO Palliative Medicine

## 2014-01-26 ENCOUNTER — Telehealth: Payer: Self-pay | Admitting: *Deleted

## 2014-01-26 NOTE — Telephone Encounter (Signed)
[name] patient's caregiver wants to know if she drops off Long term Insurance paperwork, could she pick up asap.  [facility] and paperwork needs to completed asap.  Please call and advise.

## 2014-01-27 NOTE — Telephone Encounter (Signed)
Returned caregiver's call. Phone disconnected.

## 2014-01-30 DIAGNOSIS — Z0289 Encounter for other administrative examinations: Secondary | ICD-10-CM

## 2014-02-06 ENCOUNTER — Telehealth: Payer: Self-pay | Admitting: Neurology

## 2014-02-06 NOTE — Telephone Encounter (Signed)
Caregiver calling to check status of FMLA paperwork.  Please call and advise.

## 2014-02-07 NOTE — Telephone Encounter (Signed)
Caregiver Lora calling again to check on the status of his long term insurance paperwork (not FMLA, since Mervyn GayLora says he has been long retired). Please call and advise.

## 2014-02-09 NOTE — Telephone Encounter (Signed)
Patient's caregiver Mervyn GayLora calling back again to check on the status of his long term insurance paperwork, states this is the 3rd time she has called and she has not heard anything back yet. Please return call and advise.

## 2014-02-09 NOTE — Telephone Encounter (Signed)
Spoke to caregiver and explained out 2 week turn around time, and that time is 02-13-14.  We will call her as soon as completed.

## 2014-02-10 NOTE — Telephone Encounter (Signed)
Forms have been completed and sent to Medical Records for patient's caregiver to be called.

## 2014-03-07 ENCOUNTER — Ambulatory Visit: Payer: Medicare Other | Admitting: Nurse Practitioner

## 2014-05-18 ENCOUNTER — Telehealth: Payer: Self-pay

## 2014-05-18 NOTE — Telephone Encounter (Signed)
Mr. Pinard died @ Home per Ileene Hutchinson

## 2014-05-23 DEATH — deceased

## 2014-06-05 ENCOUNTER — Telehealth: Payer: Self-pay | Admitting: *Deleted

## 2014-06-05 NOTE — Telephone Encounter (Signed)
ERROR

## 2014-06-06 DIAGNOSIS — Z0289 Encounter for other administrative examinations: Secondary | ICD-10-CM

## 2014-06-26 ENCOUNTER — Telehealth: Payer: Self-pay | Admitting: *Deleted

## 2014-06-26 NOTE — Telephone Encounter (Signed)
I received Forms from Medical Records and I have placed them in the Providers file and have placed them on his assistants In-Box to be completed. 

## 2014-06-26 NOTE — Telephone Encounter (Signed)
Noted. EPIC indicates that this patient is deceased.

## 2014-07-12 NOTE — Telephone Encounter (Signed)
Forms completed by Dr. Anne HahnWillis, to MR.

## 2014-07-14 NOTE — Telephone Encounter (Signed)
Patient's caregiver called to ask if we can receive copy of patient's POA through email and advised her that it would have to be faxed over to us. Caregiver is working on getting that done.

## 2014-07-17 NOTE — Telephone Encounter (Signed)
Patient's caregiver returning missed call from us but no message was left, please return call and advise.

## 2014-07-17 NOTE — Telephone Encounter (Signed)
Patient's caregiver can be reached at [phone].

## 2014-07-17 NOTE — Telephone Encounter (Signed)
Spoke to deceased patient's caregiver, Vernona RiegerLaura and she will contact Lupita LeashDonna in Medical records to get the completed forms, I'm not sure if a POA is necessary.

## 2014-07-18 ENCOUNTER — Telehealth: Payer: Self-pay | Admitting: *Deleted

## 2014-07-18 NOTE — Telephone Encounter (Signed)
Form,[employer] [address] Anne HahnWillis completed,faxed 07/18/14.

## 2014-11-16 ENCOUNTER — Encounter: Payer: Self-pay | Admitting: Gastroenterology

## 2015-06-06 IMAGING — CT CT HEAD W/O CM
3 of 10 series · 12 of 47 positions shown, 14 images · non-contrast
Comparison: Head CT without contrast 12/15/2013 and head and
cervical spine CT 10/24/2013.

CLINICAL DATA: 89-year-old male status post fall yesterday but now
with altered mental status. Initial encounter.

EXAM:
CT HEAD WITHOUT CONTRAST
CT CERVICAL SPINE WITHOUT CONTRAST
TECHNIQUE: Multidetector CT imaging of the head and cervical spine was
performed following the standard protocol without intravenous
contrast. Multiplanar CT image reconstructions of the cervical spine
were also generated.

[Series 9: coronals · coronal · 0.23mm/px · 3 of 38 slices shown]
[im 10/38  brain]
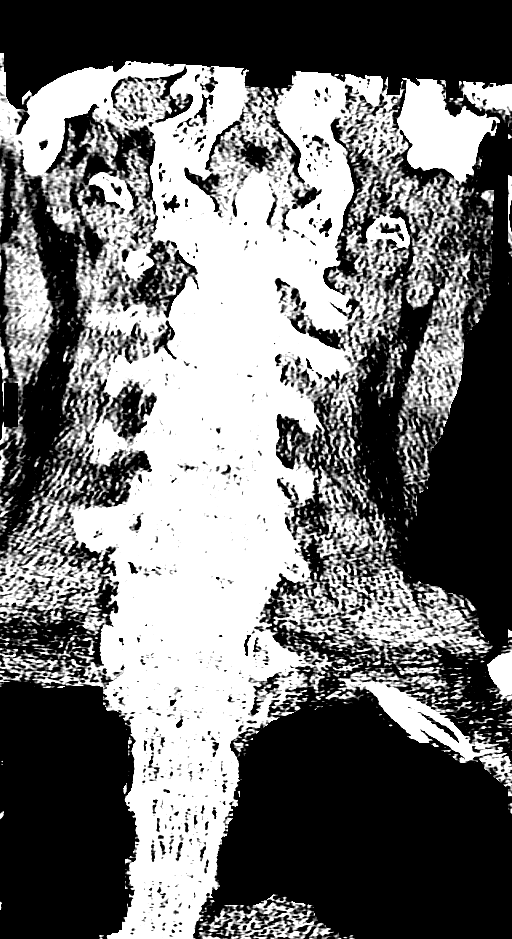
[im 19/38  brain]
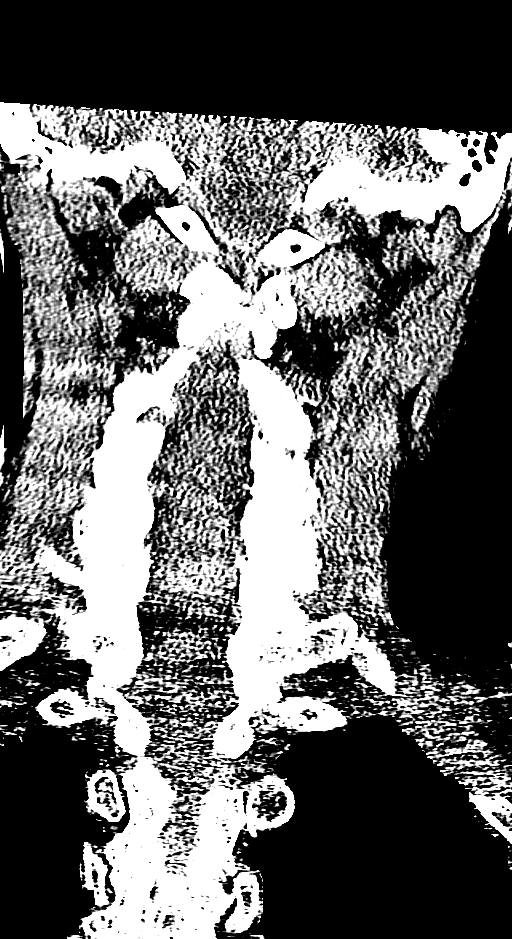
[im 28/38  brain]
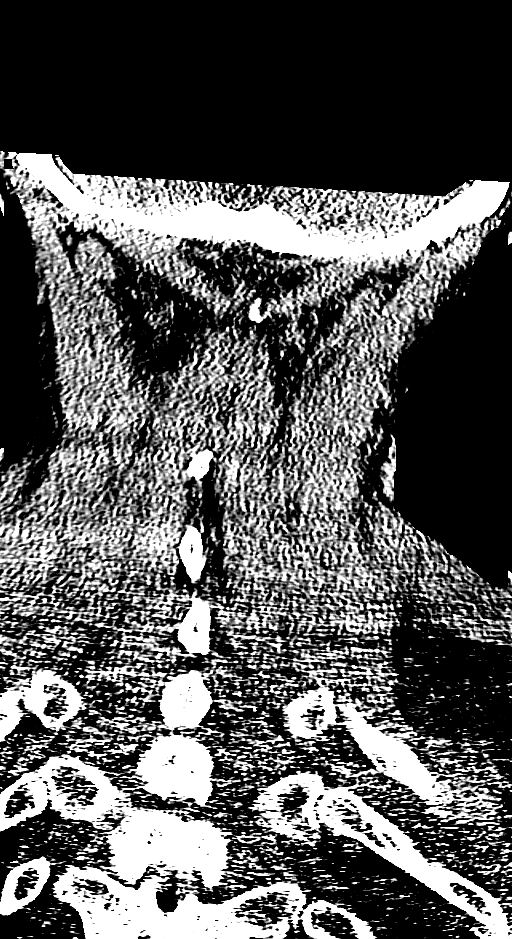

[Series 11: orthogonals · axial · 0.23mm/px · z∈[-282,-150]mm · 7 of 102 slices shown, 9 images]
[im 13/102  brain]
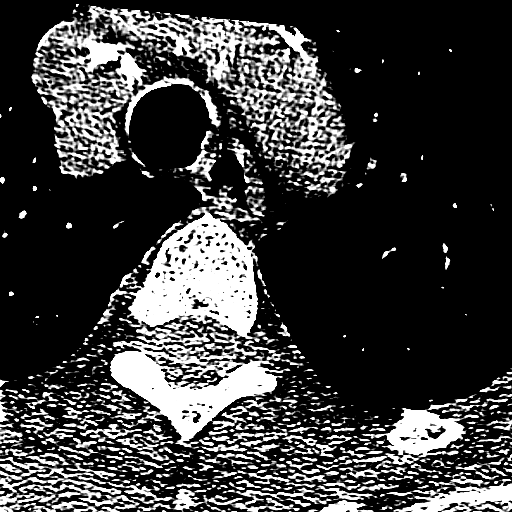
[im 13/102  bone]
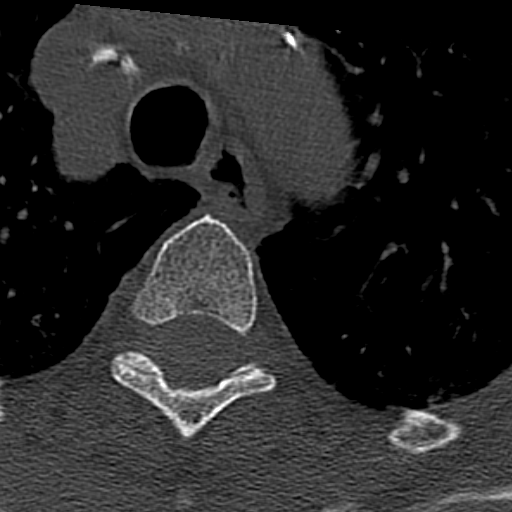
[im 26/102  brain]
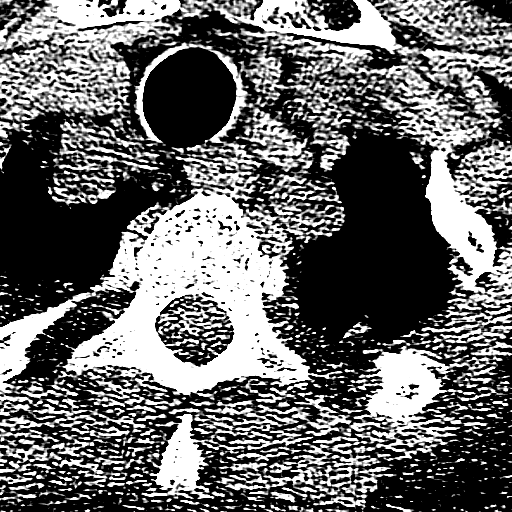
[im 38/102  brain]
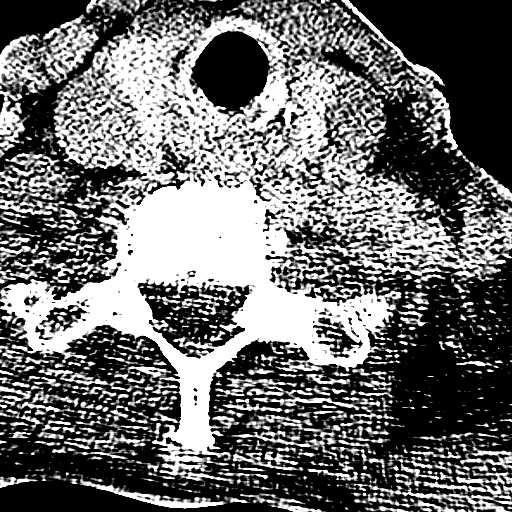
[im 51/102  brain]
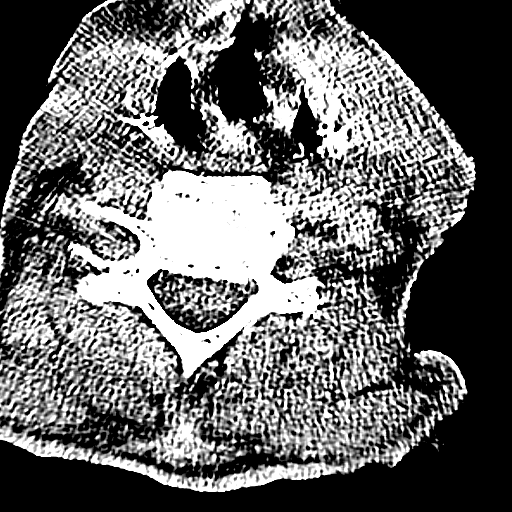
[im 64/102  brain]
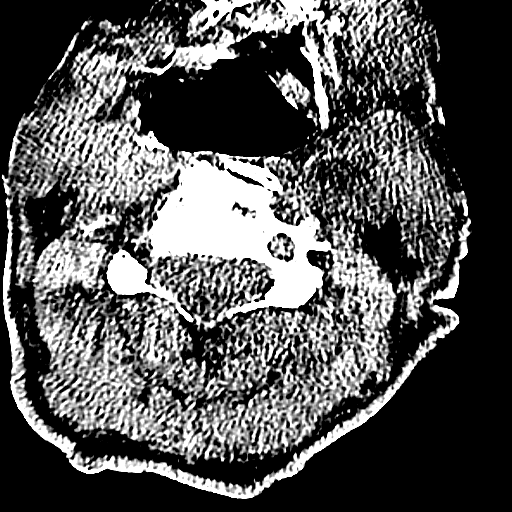
[im 64/102  bone]
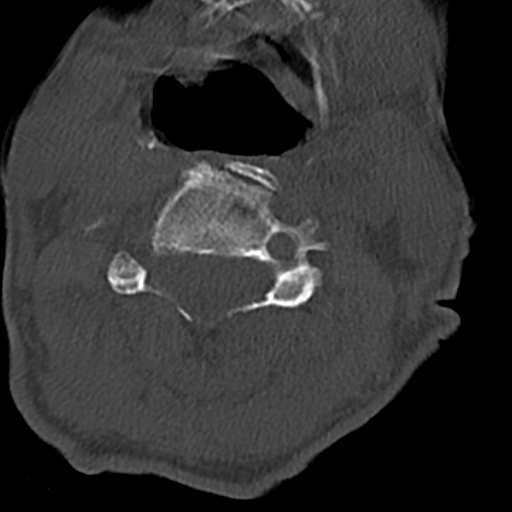
[im 76/102  brain]
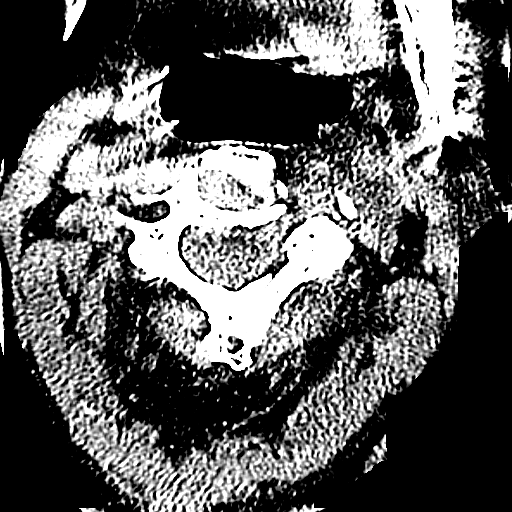
[im 89/102  brain]
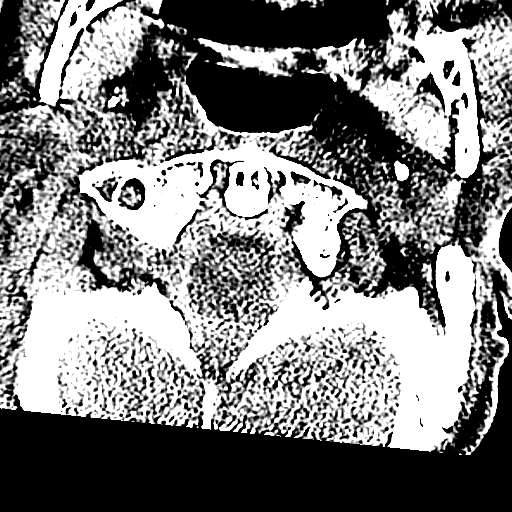

[Series 16: sagittals · sagittal · 0.21mm/px · 2 of 40 slices shown]
[im 14/40  brain]
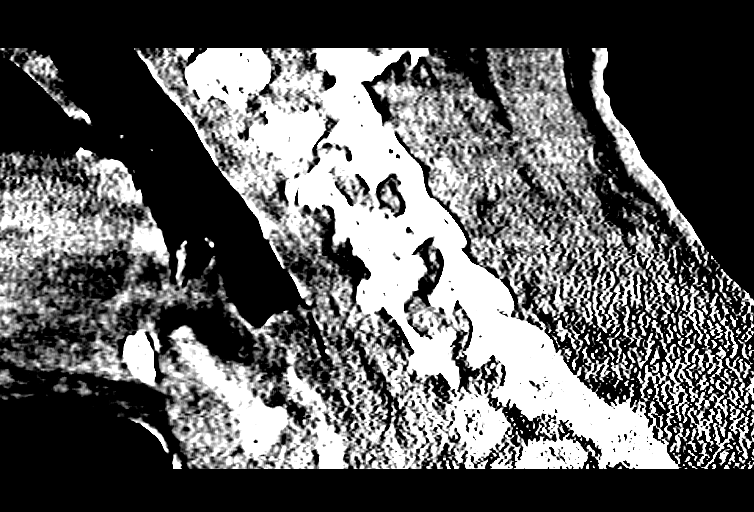
[im 27/40  brain]
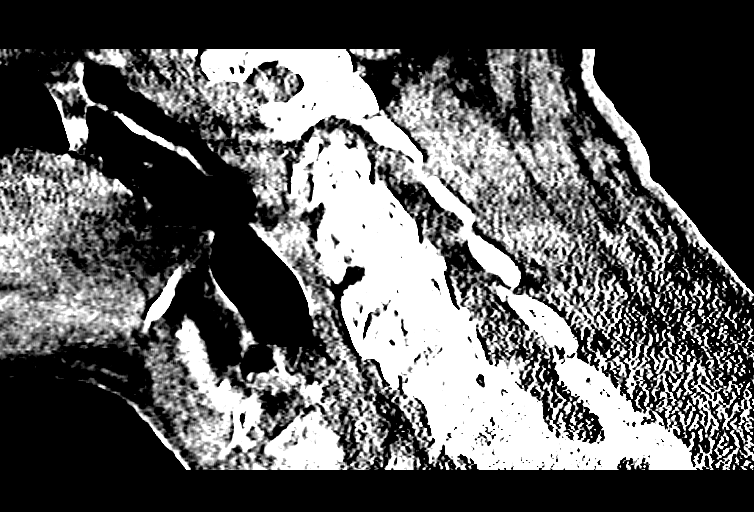

[12 of 47 positions shown; findings below may reference images not displayed]

FINDINGS: CT HEAD FINDINGS

Less motion artifact today. No scalp hematoma identified. Negative
orbits soft tissues. Visualized paranasal sinuses and mastoids are
clear. No skull fracture identified. Calcified atherosclerosis at
the skull base.

New low-density extra-axial collection on the right measuring up to
11 mm in thickness. Associated trace leftward midline shift. A tiny
amount of hyperdense blood products are identified associated with
this low-density collection (series 2, image 13). Furthermore, there
is trace hemorrhage in the apex of the third ventricle (series 2,
image 16), also new. No layering intraventricular hemorrhage. Mild
effacement of the right lateral ventricle. No ventriculomegaly.
Basilar cisterns are patent.

No definite parenchymal contusion. Stable gray-white matter
differentiation throughout the brain. No evidence of cortically
based acute infarction identified.

CT CERVICAL SPINE FINDINGS

Stable straightening of cervical lordosis. Visualized skull base is
intact. No atlanto-occipital dissociation. Bilateral posterior
element alignment is within normal limits. Chronic C4-C5 interbody
arthrodesis. Multilevel degenerative endplate spurring.
Cervicothoracic junction alignment is within normal limits. No acute
cervical spine fracture identified. Motion artifact in the
visualized upper chest. Grossly intact visualized upper thoracic
levels. Stable lung apices.
IMPRESSION: 1. New right side extra-axial collection measuring up to 11 mm in
thickness favored to be a hyperacute subdural hematoma. Associated
mild mass effect on the right lateral ventricle and trace leftward
midline shift.
2. Trace intraventricular hemorrhage.  No ventriculomegaly.
3. No acute fracture or listhesis identified in the cervical spine.
Ligamentous injury is not excluded.
Study discussed by telephone with Dr. HUMERA BAROT on 12/29/2013 at

## 2015-07-18 NOTE — Telephone Encounter (Signed)
Error
